# Patient Record
Sex: Female | Born: 1970 | Race: White | Hispanic: No | Smoking: Current every day smoker
Health system: Southern US, Community
[De-identification: ages and names within clinical notes are randomized; demographics above are authoritative.]

## PROBLEM LIST (undated history)

## (undated) DIAGNOSIS — F209 Schizophrenia, unspecified: Secondary | ICD-10-CM

## (undated) DIAGNOSIS — R0902 Hypoxemia: Secondary | ICD-10-CM

## (undated) DIAGNOSIS — I208 Other forms of angina pectoris: Secondary | ICD-10-CM

## (undated) DIAGNOSIS — E785 Hyperlipidemia, unspecified: Secondary | ICD-10-CM

## (undated) DIAGNOSIS — J449 Chronic obstructive pulmonary disease, unspecified: Secondary | ICD-10-CM

## (undated) DIAGNOSIS — Z720 Tobacco use: Secondary | ICD-10-CM

## (undated) DIAGNOSIS — F1010 Alcohol abuse, uncomplicated: Secondary | ICD-10-CM

## (undated) DIAGNOSIS — I2089 Other forms of angina pectoris: Secondary | ICD-10-CM

## (undated) HISTORY — DX: Hyperlipidemia, unspecified: E78.5

## (undated) HISTORY — DX: Schizophrenia, unspecified: F20.9

## (undated) HISTORY — DX: Alcohol abuse, uncomplicated: F10.10

## (undated) HISTORY — DX: Hypoxemia: R09.02

## (undated) HISTORY — DX: Other forms of angina pectoris: I20.8

## (undated) HISTORY — DX: Tobacco use: Z72.0

## (undated) HISTORY — DX: Chronic obstructive pulmonary disease, unspecified: J44.9

## (undated) HISTORY — DX: Other forms of angina pectoris: I20.89

---

## 2016-04-29 ENCOUNTER — Inpatient Hospital Stay (HOSPITAL_COMMUNITY)
Admission: AD | Admit: 2016-04-29 | Discharge: 2016-05-07 | DRG: 193 | Disposition: A | Discharge status: Home / Self Care | Payer: Medicaid Other | Source: Other Acute Inpatient Hospital | Attending: Internal Medicine | Admitting: Internal Medicine

## 2016-04-29 ENCOUNTER — Inpatient Hospital Stay (HOSPITAL_COMMUNITY): Payer: Medicaid Other

## 2016-04-29 DIAGNOSIS — F209 Schizophrenia, unspecified: Secondary | ICD-10-CM | POA: Diagnosis present

## 2016-04-29 DIAGNOSIS — F1010 Alcohol abuse, uncomplicated: Secondary | ICD-10-CM | POA: Diagnosis present

## 2016-04-29 DIAGNOSIS — J8489 Other specified interstitial pulmonary diseases: Secondary | ICD-10-CM | POA: Diagnosis present

## 2016-04-29 DIAGNOSIS — D696 Thrombocytopenia, unspecified: Secondary | ICD-10-CM | POA: Diagnosis not present

## 2016-04-29 DIAGNOSIS — F17210 Nicotine dependence, cigarettes, uncomplicated: Secondary | ICD-10-CM | POA: Diagnosis present

## 2016-04-29 DIAGNOSIS — R0902 Hypoxemia: Secondary | ICD-10-CM

## 2016-04-29 DIAGNOSIS — E785 Hyperlipidemia, unspecified: Secondary | ICD-10-CM | POA: Diagnosis present

## 2016-04-29 DIAGNOSIS — F329 Major depressive disorder, single episode, unspecified: Secondary | ICD-10-CM | POA: Diagnosis present

## 2016-04-29 DIAGNOSIS — J189 Pneumonia, unspecified organism: Secondary | ICD-10-CM | POA: Diagnosis present

## 2016-04-29 DIAGNOSIS — Z7952 Long term (current) use of systemic steroids: Secondary | ICD-10-CM

## 2016-04-29 DIAGNOSIS — J810 Acute pulmonary edema: Secondary | ICD-10-CM | POA: Diagnosis not present

## 2016-04-29 DIAGNOSIS — B349 Viral infection, unspecified: Secondary | ICD-10-CM | POA: Diagnosis present

## 2016-04-29 DIAGNOSIS — R0603 Acute respiratory distress: Secondary | ICD-10-CM

## 2016-04-29 DIAGNOSIS — R918 Other nonspecific abnormal finding of lung field: Secondary | ICD-10-CM | POA: Diagnosis not present

## 2016-04-29 DIAGNOSIS — R509 Fever, unspecified: Secondary | ICD-10-CM

## 2016-04-29 DIAGNOSIS — J440 Chronic obstructive pulmonary disease with acute lower respiratory infection: Secondary | ICD-10-CM | POA: Diagnosis present

## 2016-04-29 DIAGNOSIS — R7982 Elevated C-reactive protein (CRP): Secondary | ICD-10-CM | POA: Diagnosis present

## 2016-04-29 DIAGNOSIS — I959 Hypotension, unspecified: Secondary | ICD-10-CM | POA: Diagnosis present

## 2016-04-29 DIAGNOSIS — J9600 Acute respiratory failure, unspecified whether with hypoxia or hypercapnia: Secondary | ICD-10-CM

## 2016-04-29 DIAGNOSIS — E8770 Fluid overload, unspecified: Secondary | ICD-10-CM | POA: Diagnosis present

## 2016-04-29 DIAGNOSIS — D6959 Other secondary thrombocytopenia: Secondary | ICD-10-CM | POA: Diagnosis present

## 2016-04-29 DIAGNOSIS — Z720 Tobacco use: Secondary | ICD-10-CM | POA: Diagnosis not present

## 2016-04-29 DIAGNOSIS — J9601 Acute respiratory failure with hypoxia: Secondary | ICD-10-CM

## 2016-04-29 DIAGNOSIS — J441 Chronic obstructive pulmonary disease with (acute) exacerbation: Secondary | ICD-10-CM | POA: Diagnosis present

## 2016-04-29 DIAGNOSIS — J80 Acute respiratory distress syndrome: Secondary | ICD-10-CM | POA: Diagnosis present

## 2016-04-29 DIAGNOSIS — J159 Unspecified bacterial pneumonia: Secondary | ICD-10-CM | POA: Diagnosis present

## 2016-04-29 DIAGNOSIS — Z79899 Other long term (current) drug therapy: Secondary | ICD-10-CM

## 2016-04-29 DIAGNOSIS — F419 Anxiety disorder, unspecified: Secondary | ICD-10-CM | POA: Diagnosis present

## 2016-04-29 LAB — RESPIRATORY PANEL BY PCR

## 2016-04-29 LAB — COMPREHENSIVE METABOLIC PANEL
ALT: 136 U/L — ABNORMAL HIGH (ref 14–54)
AST: 83 U/L — ABNORMAL HIGH (ref 15–41)
Albumin: 2.6 g/dL — ABNORMAL LOW (ref 3.5–5.0)
Alkaline Phosphatase: 99 U/L (ref 38–126)
Anion gap: 12 (ref 5–15)
BUN: 15 mg/dL (ref 6–20)
CO2: 21 mmol/L — ABNORMAL LOW (ref 22–32)
Calcium: 8.3 mg/dL — ABNORMAL LOW (ref 8.9–10.3)
Chloride: 110 mmol/L (ref 101–111)
Creatinine, Ser: 0.73 mg/dL (ref 0.44–1.00)
GFR calc Af Amer: >60 mL/min (ref >60)
GFR calc non Af Amer: >60 mL/min (ref >60)
Glucose, Bld: 98 mg/dL (ref 65–99)
Potassium: 3.6 mmol/L (ref 3.5–5.1)
Sodium: 143 mmol/L (ref 135–145)
Total Bilirubin: 0.7 mg/dL (ref 0.3–1.2)
Total Protein: 6 g/dL — ABNORMAL LOW (ref 6.5–8.1)

## 2016-04-29 LAB — TROPONIN I
Troponin I: <0.03 ng/mL (ref <0.03)
Troponin I: <0.03 ng/mL (ref <0.03)
Troponin I: <0.03 ng/mL (ref <0.03)

## 2016-04-29 LAB — MAGNESIUM: MAGNESIUM: 1.7 mg/dL (ref 1.7–2.4)

## 2016-04-29 LAB — RAPID URINE DRUG SCREEN, HOSP PERFORMED
AMPHETAMINES: NOT DETECTED
Barbiturates: NOT DETECTED
Benzodiazepines: NOT DETECTED
Cocaine: NOT DETECTED
Opiates: NOT DETECTED
Tetrahydrocannabinol: NOT DETECTED

## 2016-04-29 LAB — GLUCOSE, CAPILLARY
Glucose-Capillary: 111 mg/dL — ABNORMAL HIGH (ref 65–99)
Glucose-Capillary: 116 mg/dL — ABNORMAL HIGH (ref 65–99)
Glucose-Capillary: 119 mg/dL — ABNORMAL HIGH (ref 65–99)
Glucose-Capillary: 142 mg/dL — ABNORMAL HIGH (ref 65–99)

## 2016-04-29 LAB — LACTIC ACID, PLASMA
LACTIC ACID, VENOUS: 1.3 mmol/L (ref 0.5–1.9)
Lactic Acid, Venous: 1 mmol/L (ref 0.5–1.9)

## 2016-04-29 LAB — CBC
HEMATOCRIT: 38.7 % (ref 36.0–46.0)
Hemoglobin: 13 g/dL (ref 12.0–15.0)
MCH: 32.6 pg (ref 26.0–34.0)
MCHC: 33.6 g/dL (ref 30.0–36.0)
MCV: 97 fL (ref 78.0–100.0)
Platelets: 95 10*3/uL — ABNORMAL LOW (ref 150–400)
RBC: 3.99 MIL/uL (ref 3.87–5.11)
RDW: 18.2 % — AB (ref 11.5–15.5)
WBC: 10.2 10*3/uL (ref 4.0–10.5)

## 2016-04-29 LAB — MRSA PCR SCREENING: MRSA BY PCR: NEGATIVE

## 2016-04-29 LAB — PHOSPHORUS: PHOSPHORUS: 3.3 mg/dL (ref 2.5–4.6)

## 2016-04-29 LAB — PROCALCITONIN: Procalcitonin: 4.27 ng/mL

## 2016-04-29 MED ORDER — SODIUM CHLORIDE 0.9 % IV SOLN: INTRAVENOUS | Status: DC

## 2016-04-29 MED ORDER — HEPARIN SODIUM (PORCINE) 5000 UNIT/ML IJ SOLN
5000.0000 [IU] | Freq: Three times a day (TID) | INTRAMUSCULAR | Status: DC
  Administered 2016-04-29 – 2016-05-07 (×24): 5000 [IU] via SUBCUTANEOUS
  Filled 2016-04-29 (×26): qty 1

## 2016-04-29 MED ORDER — DEXTROSE 5 % IV SOLN
500.0000 mg | INTRAVENOUS | Status: DC
  Administered 2016-04-29 – 2016-05-03 (×5): 500 mg via INTRAVENOUS
  Filled 2016-04-29 (×5): qty 500

## 2016-04-29 MED ORDER — SODIUM CHLORIDE 0.9 % IV SOLN
250.0000 mL | INTRAVENOUS | Status: DC | PRN
  Administered 2016-05-04: 10 mL/h via INTRAVENOUS

## 2016-04-29 MED ORDER — IBUPROFEN 400 MG PO TABS
400.0000 mg | ORAL_TABLET | Freq: Once | ORAL | Status: AC
  Administered 2016-04-29: 400 mg via ORAL
  Filled 2016-04-29: qty 1

## 2016-04-29 MED ORDER — PNEUMOCOCCAL VAC POLYVALENT 25 MCG/0.5ML IJ INJ
0.5000 mL | INJECTION | INTRAMUSCULAR | Status: AC
  Administered 2016-04-30: 0.5 mL via INTRAMUSCULAR
  Filled 2016-04-29: qty 0.5

## 2016-04-29 MED ORDER — HEPARIN SODIUM (PORCINE) 5000 UNIT/ML IJ SOLN: 5000.0000 [IU] | Freq: Three times a day (TID) | INTRAMUSCULAR | Status: DC

## 2016-04-29 MED ORDER — THIAMINE HCL 100 MG/ML IJ SOLN
100.0000 mg | Freq: Every day | INTRAMUSCULAR | Status: DC
  Administered 2016-04-29 – 2016-05-03 (×5): 100 mg via INTRAVENOUS
  Filled 2016-04-29: qty 1
  Filled 2016-04-29: qty 2
  Filled 2016-04-29 (×4): qty 1

## 2016-04-29 MED ORDER — ALBUTEROL SULFATE (2.5 MG/3ML) 0.083% IN NEBU: 2.5000 mg | INHALATION_SOLUTION | RESPIRATORY_TRACT | Status: DC | PRN

## 2016-04-29 MED ORDER — DEXTROSE 5 % IV SOLN
1.0000 g | INTRAVENOUS | Status: AC
  Administered 2016-04-29 – 2016-05-05 (×7): 1 g via INTRAVENOUS
  Filled 2016-04-29 (×7): qty 10

## 2016-04-29 MED ORDER — METHYLPREDNISOLONE SODIUM SUCC 40 MG IJ SOLR
40.0000 mg | Freq: Two times a day (BID) | INTRAMUSCULAR | Status: DC
  Administered 2016-04-29 (×2): 40 mg via INTRAVENOUS
  Filled 2016-04-29 (×3): qty 1

## 2016-04-29 MED ORDER — IPRATROPIUM-ALBUTEROL 0.5-2.5 (3) MG/3ML IN SOLN
3.0000 mL | Freq: Four times a day (QID) | RESPIRATORY_TRACT | Status: DC
  Administered 2016-04-29 – 2016-05-06 (×28): 3 mL via RESPIRATORY_TRACT
  Filled 2016-04-29 (×27): qty 3

## 2016-04-29 MED ORDER — FOLIC ACID 5 MG/ML IJ SOLN
1.0000 mg | Freq: Every day | INTRAMUSCULAR | Status: DC
  Administered 2016-04-29 – 2016-05-03 (×5): 1 mg via INTRAVENOUS
  Filled 2016-04-29 (×6): qty 0.2

## 2016-04-29 NOTE — Progress Notes (Signed)
Pharmacy Antibiotic Note  Katherine LusterDeborah A [name] is a 46 y.o. female admitted on 04/29/2016 with pneumonia.  Pharmacy has been consulted for ceftriaxone (also on azithromycin) dosing for community-acquired pneumonia.   Plan: Ceftriaxone 1g IV every 24 hours.  No renal adjustment needed - Pharmacy will sign off consult.   Height: 5' 3.5" (161.3 cm) Weight: 163 lb 9.3 oz (74.2 kg) IBW/kg (Calculated) : 53.55  Temp (24hrs), Avg:98.7 F (37.1 C), Min:98.7 F (37.1 C), Max:98.7 F (37.1 C)  No results for input(s): WBC, CREATININE, LATICACIDVEN, VANCOTROUGH, VANCOPEAK, VANCORANDOM, GENTTROUGH, GENTPEAK, GENTRANDOM, TOBRATROUGH, TOBRAPEAK, TOBRARND, AMIKACINPEAK, AMIKACINTROU, AMIKACIN in the last 168 hours.  CrCl cannot be calculated (No order found.).    Allergies  Allergen Reactions  . Doxycycline Swelling    Antimicrobials this admission: Ceftriaxone 04/29/16 >> Azithromycin 04/29/16 >>  Dose adjustments this admission: n/a  Microbiology results: 1/25 BCx:  1/25 Respiratory Panel PCR:  1/25 Sputum:  1/25 MRSA PCR:  Thank you for allowing pharmacy to be a part of this patient's care.  Link SnufferJessica Leontae Bostock, PharmD, BCPS Clinical Pharmacist Clinical Phone 04/29/2016 until 3:30 PM - [phone] After hours, please call #[phone] 04/29/2016 8:56 AM

## 2016-04-29 NOTE — H&P (Signed)
PULMONARY / CRITICAL CARE MEDICINE   Name: Katherine [name] MRN: [id number] DOB: 01-23-1971    ADMISSION DATE:  04/29/2016 CONSULTATION DATE:  04/29/2016  REFERRING MD:  Dr. Carver FilaFrisco, [facility]   CHIEF COMPLAINT:  Dyspnea   HISTORY OF PRESENT ILLNESS:   46 year old female with PMH of ETOH, tobacco use, hyperlipidemia and schizophrenia, [facility] with history of 4 days of fever, chills, cough, and body aches, on arrival to ED required NRB, CXR revealed diffuse bilateral interstitial opacities, ultimately [facility] for [facility]M.   PAST MEDICAL HISTORY :  She  has no past medical history on file.  PAST SURGICAL HISTORY: She  has no past surgical history on file.  Allergies  Allergen Reactions  . Doxycycline Swelling    No current facility-administered medications on file prior to encounter.    No current outpatient prescriptions on file prior to encounter.    FAMILY HISTORY:  Her has no family status information on file.    SOCIAL HISTORY: She    REVIEW OF SYSTEMS:   All negative; except for those that are bolded, which indicate positives.  Constitutional: weight loss, weight gain, night sweats, fevers, chills, fatigue, weakness.  HEENT: headaches, sore throat, sneezing, nasal congestion, post nasal drip, difficulty swallowing, tooth/dental problems, visual complaints, visual changes, ear aches. Neuro: difficulty with speech, weakness, numbness, ataxia. CV:  chest pain, orthopnea, PND, swelling in lower extremities, dizziness, palpitations, syncope.  Resp: cough, hemoptysis, dyspnea, wheezing. GI: heartburn, indigestion, abdominal pain, nausea, vomiting, diarrhea, constipation, change in bowel habits, loss of appetite, hematemesis, melena, hematochezia.  GU: dysuria, change in color of urine, urgency or frequency, flank pain, hematuria. MSK: joint pain or swelling, decreased range of motion. Psych:  change in mood or affect, depression, anxiety, suicidal ideations, homicidal ideations. Skin: rash, itching, bruising.  SUBJECTIVE:  Remains on NRB, tachypneic RR 30-40   VITAL SIGNS: BP 102/66   Pulse 84   Resp (!) 38   SpO2 93%   HEMODYNAMICS:    VENTILATOR SETTINGS:    INTAKE / OUTPUT: I/O last 3 completed shifts: In: 10 [I.V.:10] Out: -   PHYSICAL EXAMINATION: General: Adult female, no distress, lying in bed  Neuro: alert, oriented, follows commands, moves all extremities   HEENT: normocephalic  Cardiovascular: No MRG, Tachy, regular rhythm, NI S1/S2 Lungs: rhonchi noted throughout, diminished at bases, unlabored Abdomen: non-tender, non-distend, active bowel sounds  Musculoskeletal: no deformities  Skin: warm, dry, intact    LABS:  BMET No results for input(s): NA, K, CL, CO2, BUN, CREATININE, GLUCOSE in the last 168 hours.  Electrolytes No results for input(s): CALCIUM, MG, PHOS in the last 168 hours.  CBC No results for input(s): WBC, HGB, HCT, PLT in the last 168 hours.  Coag's No results for input(s): APTT, INR in the last 168 hours.  Sepsis Markers No results for input(s): LATICACIDVEN, PROCALCITON, O2SATVEN in the last 168 hours.  ABG No results for input(s): PHART, PCO2ART, PO2ART in the last 168 hours.  Liver Enzymes No results for input(s): AST, ALT, ALKPHOS, BILITOT, ALBUMIN in the last 168 hours.  Cardiac Enzymes No results for input(s): TROPONINI, PROBNP in the last 168 hours.  Glucose No results for input(s): GLUCAP in the last 168 hours.  Imaging No results found.   STUDIES:  CXR 1/25 > diffuse airspace disease consistent with diffuse PNR vs ARDS   CULTURES: Blood 1/25 > Sputum 1/25 > Urine 1/25 >  ANTIBIOTICS: Azithromycin 1/25 > Rocephin 1/25 >   SIGNIFICANT EVENTS: 1/25 > Presents to OSH with severe respiratory distress   LINES/TUBES:   DISCUSSION: 46 year old presents to ED with 4 day history of fever,  chills, and cough. CXR reveals bilateral diffuse interstitial opacities.   ASSESSMENT / PLAN:  PULMONARY A: Acute Hypoxic Respiratory Failure secondary to bacterial PNA vs viral source  P:   Maintain Saturation >92 Duoneb q6h Albuterol PRN Solu-medrol 50 mg BID  CXR now, and in AM ABG, and in AM  May need to be intubated   CARDIOVASCULAR A:  H/O HLD  P:  Cardiac Monitoring  Trend Trop Maintain MAP >65   RENAL A:   No issues  P:   Trend BMP Replace Electrolytes as needed   GASTROINTESTINAL A:   No issues  P:   NPO   HEMATOLOGIC A:   Thrombocytopenia  P:  Trend LFT  HIV panel  Trend CBC   INFECTIOUS A:   Suspected Bacterial PNA, possible underlying viral infection P:   Send RVP PAN culture  Trend lactic Acid and Procal  Rocephin and Azithromycin for CAP coverage   ENDOCRINE A:   No issues  P:   Q4H glucose checks   NEUROLOGIC A:   H/O Schizophrenia, ETOH  P:   Thiamine and Folic acid daily  RASS goal: 0  FAMILY  - Updates: no family at bedside 1/25.   - Inter-disciplinary family meet or Palliative Care meeting due by:  05/06/2016    Jovita Kussmaul, AG-ACNP [facility]  Pgr: [phone]  PCCM Pgr: [phone]

## 2016-04-30 ENCOUNTER — Ambulatory Visit (HOSPITAL_COMMUNITY): Payer: Medicaid Other

## 2016-04-30 ENCOUNTER — Inpatient Hospital Stay (HOSPITAL_COMMUNITY): Payer: Medicaid Other

## 2016-04-30 DIAGNOSIS — J80 Acute respiratory distress syndrome: Secondary | ICD-10-CM

## 2016-04-30 LAB — BASIC METABOLIC PANEL
Anion gap: 11 (ref 5–15)
BUN: 21 mg/dL — ABNORMAL HIGH (ref 6–20)
CALCIUM: 8.2 mg/dL — AB (ref 8.9–10.3)
CHLORIDE: 106 mmol/L (ref 101–111)
CO2: 23 mmol/L (ref 22–32)
CREATININE: 0.87 mg/dL (ref 0.44–1.00)
Glucose, Bld: 136 mg/dL — ABNORMAL HIGH (ref 65–99)
Potassium: 3.1 mmol/L — ABNORMAL LOW (ref 3.5–5.1)
SODIUM: 140 mmol/L (ref 135–145)

## 2016-04-30 LAB — BLOOD GAS, ARTERIAL
Acid-Base Excess: 0.9 mmol/L (ref 0.0–2.0)
Acid-Base Excess: 2.6 mmol/L — ABNORMAL HIGH (ref 0.0–2.0)
BICARBONATE: 27.5 mmol/L (ref 20.0–28.0)
Bicarbonate: 25.7 mmol/L (ref 20.0–28.0)
DRAWN BY: 418751
Delivery systems: POSITIVE
Drawn by: 418751
Expiratory PAP: 6
FIO2: 100
FIO2: 60
INSPIRATORY PAP: 12
O2 SAT: 92.4 %
O2 Saturation: 94.4 %
PATIENT TEMPERATURE: 98.6
PATIENT TEMPERATURE: 98.6
PCO2 ART: 46.8 mmHg (ref 32.0–48.0)
PO2 ART: 68.4 mmHg — AB (ref 83.0–108.0)
PO2 ART: 77.3 mmHg — AB (ref 83.0–108.0)
RATE: 8 resp/min
pCO2 arterial: 49.2 mmHg — ABNORMAL HIGH (ref 32.0–48.0)
pH, Arterial: 7.359 (ref 7.350–7.450)
pH, Arterial: 7.365 (ref 7.350–7.450)

## 2016-04-30 LAB — ECHOCARDIOGRAM COMPLETE
CHL CUP MV DEC (S): 204
E decel time: 204 msec
FS: 38 % (ref 28–44)
Height: 63.5 in
IV/PV OW: 0.89
LA diam end sys: 31 mm
LA diam index: 1.73 cm/m2
LA vol A4C: 44.3 ml
LASIZE: 31 mm
LDCA: 2.54 cm2
LV TDI E'LATERAL: 11.5
LV TDI E'MEDIAL: 9.25
LV e' LATERAL: 11.5 cm/s
LVOT SV: 77 mL
LVOT VTI: 30.3 cm
LVOT peak grad rest: 10 mmHg
LVOTD: 18 mm
LVOTPV: 157 cm/s
Lateral S' vel: 14.5 cm/s
MVPKEVEL: 1.4 m/s
PW: 9 mm — AB (ref 0.6–1.1)
RV TAPSE: 33.2 mm
Weight: 2645.52 oz

## 2016-04-30 LAB — GLUCOSE, CAPILLARY
GLUCOSE-CAPILLARY: 124 mg/dL — AB (ref 65–99)
Glucose-Capillary: 107 mg/dL — ABNORMAL HIGH (ref 65–99)
Glucose-Capillary: 130 mg/dL — ABNORMAL HIGH (ref 65–99)
Glucose-Capillary: 126 mg/dL — ABNORMAL HIGH (ref 65–99)

## 2016-04-30 LAB — CBC
HCT: 38.8 % (ref 36.0–46.0)
Hemoglobin: 12.9 g/dL (ref 12.0–15.0)
MCH: 33.1 pg (ref 26.0–34.0)
MCHC: 33.2 g/dL (ref 30.0–36.0)
MCV: 99.5 fL (ref 78.0–100.0)
PLATELETS: 116 10*3/uL — AB (ref 150–400)
RBC: 3.9 MIL/uL (ref 3.87–5.11)
RDW: 19.1 % — AB (ref 11.5–15.5)
WBC: 9.4 10*3/uL (ref 4.0–10.5)

## 2016-04-30 LAB — HEPATIC FUNCTION PANEL
ALBUMIN: 2.7 g/dL — AB (ref 3.5–5.0)
ALT: 120 U/L — ABNORMAL HIGH (ref 14–54)
AST: 78 U/L — AB (ref 15–41)
Alkaline Phosphatase: 99 U/L (ref 38–126)
Bilirubin, Direct: 0.3 mg/dL (ref 0.1–0.5)
Indirect Bilirubin: 0.4 mg/dL (ref 0.3–0.9)
TOTAL PROTEIN: 6.9 g/dL (ref 6.5–8.1)
Total Bilirubin: 0.7 mg/dL (ref 0.3–1.2)

## 2016-04-30 LAB — URINE CULTURE: Culture: NO GROWTH

## 2016-04-30 LAB — PHOSPHORUS: PHOSPHORUS: 2.5 mg/dL (ref 2.5–4.6)

## 2016-04-30 LAB — MAGNESIUM: MAGNESIUM: 1.9 mg/dL (ref 1.7–2.4)

## 2016-04-30 LAB — HIV ANTIBODY (ROUTINE TESTING W REFLEX): HIV Screen 4th Generation wRfx: NONREACTIVE

## 2016-04-30 LAB — PROCALCITONIN: PROCALCITONIN: 4.67 ng/mL

## 2016-04-30 MED ORDER — FUROSEMIDE 10 MG/ML IJ SOLN
20.0000 mg | Freq: Once | INTRAMUSCULAR | Status: AC
  Administered 2016-04-30: 20 mg via INTRAVENOUS
  Filled 2016-04-30: qty 2

## 2016-04-30 MED ORDER — SODIUM CHLORIDE 0.9 % IV SOLN
30.0000 meq | Freq: Once | INTRAVENOUS | Status: AC
  Administered 2016-04-30: 30 meq via INTRAVENOUS
  Filled 2016-04-30: qty 15

## 2016-04-30 MED ORDER — METHYLPREDNISOLONE SODIUM SUCC 40 MG IJ SOLR
40.0000 mg | Freq: Every day | INTRAMUSCULAR | Status: DC
  Administered 2016-04-30 – 2016-05-02 (×3): 40 mg via INTRAVENOUS
  Filled 2016-04-30 (×3): qty 1

## 2016-04-30 MED ORDER — ARIPIPRAZOLE 5 MG PO TABS
5.0000 mg | ORAL_TABLET | Freq: Every day | ORAL | Status: DC
  Administered 2016-04-30 – 2016-05-07 (×8): 5 mg via ORAL
  Filled 2016-04-30 (×8): qty 1

## 2016-04-30 MED ORDER — POTASSIUM CHLORIDE 20 MEQ/15ML (10%) PO SOLN: 40.0000 meq | Freq: Two times a day (BID) | ORAL | Status: DC

## 2016-04-30 MED ORDER — TRAZODONE HCL 100 MG PO TABS
100.0000 mg | ORAL_TABLET | Freq: Every evening | ORAL | Status: DC | PRN
  Administered 2016-04-30: 100 mg via ORAL
  Filled 2016-04-30 (×2): qty 1

## 2016-04-30 MED ORDER — HYDROXYZINE HCL 25 MG PO TABS
50.0000 mg | ORAL_TABLET | Freq: Three times a day (TID) | ORAL | Status: DC | PRN
  Administered 2016-04-30: 50 mg via ORAL
  Filled 2016-04-30 (×2): qty 1

## 2016-04-30 MED ORDER — DEXTROMETHORPHAN POLISTIREX ER 30 MG/5ML PO SUER
30.0000 mg | Freq: Two times a day (BID) | ORAL | Status: DC | PRN
  Administered 2016-04-30 – 2016-05-03 (×4): 30 mg via ORAL
  Filled 2016-04-30 (×5): qty 5

## 2016-04-30 MED ORDER — SERTRALINE HCL 50 MG PO TABS
25.0000 mg | ORAL_TABLET | Freq: Every day | ORAL | Status: DC
  Administered 2016-04-30 – 2016-05-07 (×8): 25 mg via ORAL
  Filled 2016-04-30 (×9): qty 1

## 2016-04-30 MED ORDER — ACETAMINOPHEN 325 MG PO TABS
650.0000 mg | ORAL_TABLET | Freq: Four times a day (QID) | ORAL | Status: DC | PRN
  Administered 2016-04-30 – 2016-05-07 (×6): 650 mg via ORAL
  Filled 2016-04-30 (×6): qty 2

## 2016-04-30 MED ORDER — ORAL CARE MOUTH RINSE
15.0000 mL | Freq: Two times a day (BID) | OROMUCOSAL | Status: DC
  Administered 2016-04-30 – 2016-05-07 (×11): 15 mL via OROMUCOSAL

## 2016-04-30 MED ORDER — SODIUM CHLORIDE 0.9 % IV BOLUS (SEPSIS)
1000.0000 mL | Freq: Once | INTRAVENOUS | Status: AC
  Administered 2016-04-30: 1000 mL via INTRAVENOUS

## 2016-04-30 NOTE — Progress Notes (Addendum)
S: Paged by RN for worsening tachypnea while sleeping. She was given 1L of fluid approximately 2 hours ago for soft BP.   O: On evaluation patient is tachypneic with increased work of breathing while sleeping. RR in the 40s but dropped to 12 after being woken up. Now  A&O x 3, breathing comfortable, and asymptomatic. She denies chest pain or worsening SOB. Desating to upper 80s on non re-breather.    Vitals:   04/30/16 0000 04/30/16 0030 04/30/16 0100 04/30/16 0130  BP: 92/65 (!) 88/45 (!) 95/47 (!) 96/53  Pulse: (!) 57 (!) 52 61 81  Resp: (!) 29 (!) 35 (!) 23 (!) 30  Temp:      TempSrc:      SpO2: 92% 92% 93% (!) 88%  Weight:      Height:       Physical Exam Constitutional: NAD, appears comfortable HEENT: on non re-breather  Cardiovascular: RRR, no murmurs, rubs, or gallops.  Pulmonary/Chest: Extensive bibasilar crackles that extend to the mid lungs bilaterally, sating 88% on non re breather, no wheezing, rales, or rhonchi   Neurological: A&Ox3, CN II - XII grossly intact.   A: 46 yo F with hx of alcohol use, HLD, and schizophrenia admitted with acute hypoxic respiratory failure secondary to CAP vs. viral infection; now with tachypnea after fluid resuscitation. CXR concerning for developing ARDS.   Plan: -- BiPAP trial  -- Will give 20 Lasix now  -- Follow BP -- Repeat CXR -- ABG  -- Continue to monitor   Reymundo Pollarolyn Ladana Chavero, M.D. Pager: [phone] 04/30/2016, 2:05 AM

## 2016-04-30 NOTE — Progress Notes (Signed)
Placed on BiPAP at this time.

## 2016-04-30 NOTE — Progress Notes (Signed)
  Echocardiogram 2D Echocardiogram has been performed.  Katherine [name] 04/30/2016, 2:38 PM

## 2016-04-30 NOTE — Progress Notes (Signed)
Repeat ABG obtained after 2 hours on BiPAP shows improvement of PaO2 at 77 on FiO2 of 60. P/F ratio improved from prior. CXR with some improvement of diffuse of airspace after breathing treatment, lasix 20 mg once and bipap. Patient is comfortable on bipap, sleeping. Discussed with attending, will continue current management and reassess in the morning. If persistently hypoxic in the morning or if patient acutely decompensates, would consider intubation.   Katherine LinemanAlexa Burns, DO PGY-3 Internal Medicine Resident Pager # [phone] 04/30/2016 4:27 AM

## 2016-04-30 NOTE — Progress Notes (Signed)
RN paged Dr.Guilloud regarding pt increased resp rate in the 40's. Pt sleeping; oxygen sat 93% on NRB.  MD aware. RN will follow orders and continue to monitor.  Roselie AwkwardMegan Dong Nimmons, RN

## 2016-04-30 NOTE — Progress Notes (Signed)
PULMONARY / CRITICAL CARE MEDICINE   Name: Katherine LusterDeborah A Pancoast MRN: [id number] DOB: 1971/01/07    ADMISSION DATE:  04/29/2016 CONSULTATION DATE:  04/29/2016  REFERRING MD:  Dr. Carver FilaFrisco, [facility]   CHIEF COMPLAINT:  Dyspnea   HISTORY OF PRESENT ILLNESS:   46 year old female with PMH of ETOH, tobacco use, hyperlipidemia and schizophrenia, presents [facility] with history of 4 days of fever, chills, cough, and body aches, on arrival to ED required NRB, CXR revealed diffuse bilateral interstitial opacities, ultimately [facility] for P[facility]M.   SUBJECTIVE:  She was given 1L IV fluids for mild hypotension overnight. Two hours later, she had increased tachypnea. She was given Lasix 20mg  IV and placed on BiPAP. She reports improvement in her breathing this morning. Denies chest pain. No other complaints.  VITAL SIGNS: BP 110/63 (BP Location: Right Arm)   Pulse 72   Temp 98.8 F (37.1 C) (Axillary)   Resp (!) 31   Ht 5' 3.5" (1.613 m)   Wt 165 lb 5.5 oz (75 kg)   SpO2 95%   BMI 28.83 kg/m   HEMODYNAMICS:    VENTILATOR SETTINGS: FiO2 (%):  [55 %-100 %] 60 %  INTAKE / OUTPUT: I/O last 3 completed shifts: In: 715 [I.V.:150; IV Piggyback:565] Out: 1160 [Urine:1160]  PHYSICAL EXAMINATION: General: Adult female, no distress, lying in bed  Neuro: alert, oriented, follows commands, moves all extremities   HEENT: normocephalic  Cardiovascular: RRR Lungs: Diminished at bases, unlabored Abdomen: non-tender, non-distend, active bowel sounds  Musculoskeletal: no deformities  Skin: warm, dry, intact    LABS:  BMET  Recent Labs Lab 04/29/16 0809 04/30/16 0221  NA 143 140  K 3.6 3.1*  CL 110 106  CO2 21* 23  BUN 15 21*  CREATININE 0.73 0.87  GLUCOSE 98 136*    Electrolytes  Recent Labs Lab 04/29/16 0809 04/29/16 1054 04/30/16 0221  CALCIUM 8.3*  --  8.2*  MG  --  1.7 1.9  PHOS  --  3.3 2.5    CBC  Recent  Labs Lab 04/29/16 0809 04/30/16 0221  WBC 10.2 9.4  HGB 13.0 12.9  HCT 38.7 38.8  PLT 95* 116*    Coag's No results for input(s): APTT, INR in the last 168 hours.  Sepsis Markers  Recent Labs Lab 04/29/16 0835 04/29/16 1054 04/30/16 0221  LATICACIDVEN 1.0 1.3  --   PROCALCITON 4.27  --  4.67    ABG  Recent Labs Lab 04/30/16 0231 04/30/16 0400  PHART 7.359 7.365  PCO2ART 46.8 49.2*  PO2ART 68.4* 77.3*    Liver Enzymes  Recent Labs Lab 04/29/16 0809  AST 83*  ALT 136*  ALKPHOS 99  BILITOT 0.7  ALBUMIN 2.6*    Cardiac Enzymes  Recent Labs Lab 04/29/16 1054 04/29/16 1406 04/29/16 2025  TROPONINI <0.03 <0.03 <0.03    Glucose  Recent Labs Lab 04/29/16 1207 04/29/16 1520 04/29/16 1932 04/29/16 2343  GLUCAP 142* 119* 116* 111*    Imaging Dg Chest Port 1 View  Result Date: 04/30/2016 CLINICAL DATA:  Initial evaluation for increase respiratory distress. EXAM: PORTABLE CHEST 1 VIEW COMPARISON:  Prior radiograph from 04/29/2016. FINDINGS: Mild cardiomegaly, stable. Mediastinal silhouette within normal limits. Lungs mildly hypoinflated. Diffuse airspace disease involving the bilateral lungs again seen, overall slightly improved. This may reflect improved edema or infection. No pleural effusion. No consolidative airspace disease. No pneumothorax. Osseous structures unchanged. IMPRESSION: 1. Slight interval improvement in  diffuse airspace and disease, which may reflect improved infection and/ or edema. 2. No other new acute cardiopulmonary abnormality identified. Electronically Signed   By: Rise Mu M.D.   On: 04/30/2016 03:52   Dg Chest Port 1 View  Result Date: 04/29/2016 CLINICAL DATA:  Acute respiratory failure EXAM: PORTABLE CHEST 1 VIEW COMPARISON:  None. FINDINGS: There is diffuse airspace disease throughout the lungs. These findings are nonspecific but could indicate diffuse pneumonia, acute respiratory distress syndrome, or less likely  edema. No effusion is seen. The heart is borderline enlarged. IMPRESSION: Diffuse airspace disease consistent with diffuse pneumonia or ARDS. Electronically Signed   By: Dwyane Dee M.D.   On: 04/29/2016 08:39     STUDIES:  CXR 1/25 > diffuse airspace disease consistent with diffuse PNA vs ARDS  CXR 1/26 > slight interval improvement of diffuse airspace disease Echo 1/26 >   CULTURES: Blood 1/25 > Sputum 1/25 > Urine 1/25 >   ANTIBIOTICS: Azithromycin 1/25 > Rocephin 1/25 >   SIGNIFICANT EVENTS: 1/25 > Presents to OSH with severe respiratory distress   LINES/TUBES: PIV  DISCUSSION: 46 year old woman presents to ED with 4 day history of fever, chills, and cough. CXR reveals bilateral diffuse interstitial opacities.   ASSESSMENT / PLAN:  PULMONARY A: Acute Hypoxic Respiratory Failure P:   Maintain Saturation >92 Duoneb scheduled Albuterol PRN Taper Solu-medrol 40 mg to daily Cont BiPAP  CARDIOVASCULAR A:  H/O HLD, Trops neg x 3 P:  Cardiac Monitoring  Maintain MAP >65   RENAL A:   No issues  P:   Trend BMP Replace Electrolytes as needed  Keep I/O even/negative  GASTROINTESTINAL A:   No issues  P:   NPO   HEMATOLOGIC A:   Thrombocytopenia, improving > HIV NR P:  Trend LFT  Trend CBC   INFECTIOUS A:   Suspected Bacterial PNA RVP neg P:    Trend Procal  Rocephin and Azithromycin for CAP coverage   ENDOCRINE A:   No issues  P:   Q4H glucose checks   NEUROLOGIC A:   H/O Schizophrenia, ETOH  P:   Thiamine and Folic acid daily  RASS goal: 0  FAMILY  - Updates: no family at bedside 1/25.   - Inter-disciplinary family meet or Palliative Care meeting due by:  05/06/2016    Griffin Basil, MD  Internal Medicine PGY-3 Pager: [phone]  PCCM Pgr: [phone] 04/30/16 8:27 AM

## 2016-04-30 NOTE — Care Management Note (Signed)
Case Management Note  Patient Details  Name: Odis LusterDeborah A Matney MRN: [id number] Date of Birth: [birth year]  Subjective/Objective:    Pt admitted with acute resp failure                Action/Plan:   PTA independent from home with friend.  CM will continue to follow for discharge needs   Expected Discharge Date:                  Expected Discharge Plan:  Home/Self Care  In-House Referral:     Discharge planning Services  CM Consult  Post Acute Care Choice:    Choice offered to:     DME Arranged:    DME Agency:     HH Arranged:    HH Agency:     Status of Service:  In process, will continue to follow  If discussed at Long Length of Stay Meetings, dates discussed:    Additional Comments:  Cherylann ParrClaxton, Dayon Witt S, RN 04/30/2016, 10:54 AM

## 2016-05-01 DIAGNOSIS — J9601 Acute respiratory failure with hypoxia: Secondary | ICD-10-CM

## 2016-05-01 DIAGNOSIS — F17210 Nicotine dependence, cigarettes, uncomplicated: Secondary | ICD-10-CM | POA: Diagnosis present

## 2016-05-01 DIAGNOSIS — D696 Thrombocytopenia, unspecified: Secondary | ICD-10-CM | POA: Diagnosis present

## 2016-05-01 LAB — COMPREHENSIVE METABOLIC PANEL
ALBUMIN: 2.3 g/dL — AB (ref 3.5–5.0)
ALT: 77 U/L — AB (ref 14–54)
AST: 48 U/L — ABNORMAL HIGH (ref 15–41)
Alkaline Phosphatase: 82 U/L (ref 38–126)
Anion gap: 6 (ref 5–15)
BUN: 22 mg/dL — AB (ref 6–20)
CHLORIDE: 108 mmol/L (ref 101–111)
CO2: 30 mmol/L (ref 22–32)
CREATININE: 0.66 mg/dL (ref 0.44–1.00)
Calcium: 8.1 mg/dL — ABNORMAL LOW (ref 8.9–10.3)
GFR calc Af Amer: >60 mL/min (ref >60)
GLUCOSE: 109 mg/dL — AB (ref 65–99)
Potassium: 3.6 mmol/L (ref 3.5–5.1)
Sodium: 144 mmol/L (ref 135–145)
Total Bilirubin: 1.5 mg/dL — ABNORMAL HIGH (ref 0.3–1.2)
Total Protein: 5.8 g/dL — ABNORMAL LOW (ref 6.5–8.1)

## 2016-05-01 LAB — CBC
HEMATOCRIT: 32.9 % — AB (ref 36.0–46.0)
HEMOGLOBIN: 11.6 g/dL — AB (ref 12.0–15.0)
MCH: 37.5 pg — AB (ref 26.0–34.0)
MCHC: 35.3 g/dL (ref 30.0–36.0)
MCV: 106.5 fL — AB (ref 78.0–100.0)
Platelets: 117 10*3/uL — ABNORMAL LOW (ref 150–400)
RBC: 3.09 MIL/uL — AB (ref 3.87–5.11)
RDW: 20.7 % — ABNORMAL HIGH (ref 11.5–15.5)
WBC: 6 10*3/uL (ref 4.0–10.5)

## 2016-05-01 LAB — PROCALCITONIN: PROCALCITONIN: 2.34 ng/mL

## 2016-05-01 LAB — GLUCOSE, CAPILLARY
GLUCOSE-CAPILLARY: 143 mg/dL — AB (ref 65–99)
GLUCOSE-CAPILLARY: 131 mg/dL — AB (ref 65–99)
Glucose-Capillary: 107 mg/dL — ABNORMAL HIGH (ref 65–99)
Glucose-Capillary: 120 mg/dL — ABNORMAL HIGH (ref 65–99)
Glucose-Capillary: 128 mg/dL — ABNORMAL HIGH (ref 65–99)
Glucose-Capillary: 81 mg/dL (ref 65–99)

## 2016-05-01 LAB — MAGNESIUM: Magnesium: 2.2 mg/dL (ref 1.7–2.4)

## 2016-05-01 LAB — PHOSPHORUS: Phosphorus: 2.2 mg/dL — ABNORMAL LOW (ref 2.5–4.6)

## 2016-05-01 LAB — HEPATITIS C ANTIBODY (REFLEX): HCV AB: 0.1 {s_co_ratio} (ref 0.0–0.9)

## 2016-05-01 LAB — HCV COMMENT:

## 2016-05-01 NOTE — Progress Notes (Signed)
PULMONARY / CRITICAL CARE MEDICINE   Name: Katherine LusterDeborah A Gramling MRN: [id number] DOB: 1970-12-19    ADMISSION DATE:  04/29/2016 CONSULTATION DATE:  04/29/2016  REFERRING MD:  Dr. Carver FilaFrisco, [facility]   CHIEF COMPLAINT:  Dyspnea   BRIEF SUMMARY:  46 year old female with PMH of ETOH, tobacco use, hyperlipidemia and schizophrenia, [facility] with history of 4 days of fever, chills, cough, and body aches, on arrival to ED required NRB, CXR revealed diffuse bilateral interstitial opacities, [facility] for [facility].   SUBJECTIVE: Tmax 99.5, remains on 15L HFNC > sats low 90's.  Pt reports feeling better overall.     VITAL SIGNS: BP 113/73   Pulse 82   Temp 99.5 F (37.5 C) (Oral)   Resp (!) 25   Ht 5' 3.5" (1.613 m)   Wt 163 lb 5.8 oz (74.1 kg)   SpO2 91%   BMI 28.48 kg/m   HEMODYNAMICS:    VENTILATOR SETTINGS:    INTAKE / OUTPUT: I/O last 3 completed shifts: In: 405 [I.V.:50; Other:90; IV Piggyback:265] Out: 1325 [Urine:1325]  PHYSICAL EXAMINATION: General:  Chronically ill appearing female in NAD HEENT: MM pink/moist, no jvd Neuro: AAOx4, speech clear, MAE  CV: s1s2 rrr, no m/r/g PULM: non-labored, crackles on L, clear on R FA:OZHYGI:soft, non-tender, bsx4 active  Extremities: warm/dry, no edema  Skin: no rashes or lesions.  Multiple tattoos.    LABS:  BMET  Recent Labs Lab 04/29/16 0809 04/30/16 0221 05/01/16 0310  NA 143 140 144  K 3.6 3.1* 3.6  CL 110 106 108  CO2 21* 23 30  BUN 15 21* 22*  CREATININE 0.73 0.87 0.66  GLUCOSE 98 136* 109*    Electrolytes  Recent Labs Lab 04/29/16 0809 04/29/16 1054 04/30/16 0221 05/01/16 0310  CALCIUM 8.3*  --  8.2* 8.1*  MG  --  1.7 1.9 2.2  PHOS  --  3.3 2.5 2.2*    CBC  Recent Labs Lab 04/29/16 0809 04/30/16 0221 05/01/16 0310  WBC 10.2 9.4 6.0  HGB 13.0 12.9 11.6*  HCT 38.7 38.8 32.9*  PLT 95* 116* 117*    Coag's No results  for input(s): APTT, INR in the last 168 hours.  Sepsis Markers  Recent Labs Lab 04/29/16 0835 04/29/16 1054 04/30/16 0221 05/01/16 0310  LATICACIDVEN 1.0 1.3  --   --   PROCALCITON 4.27  --  4.67 2.34    ABG  Recent Labs Lab 04/30/16 0231 04/30/16 0400  PHART 7.359 7.365  PCO2ART 46.8 49.2*  PO2ART 68.4* 77.3*    Liver Enzymes  Recent Labs Lab 04/29/16 0809 04/30/16 0854 05/01/16 0310  AST 83* 78* 48*  ALT 136* 120* 77*  ALKPHOS 99 99 82  BILITOT 0.7 0.7 1.5*  ALBUMIN 2.6* 2.7* 2.3*    Cardiac Enzymes  Recent Labs Lab 04/29/16 1054 04/29/16 1406 04/29/16 2025  TROPONINI <0.03 <0.03 <0.03    Glucose  Recent Labs Lab 04/30/16 1535 04/30/16 2042 05/01/16 0017 05/01/16 0518 05/01/16 0851 05/01/16 1233  GLUCAP 130* 126* 120* 107* 81 128*    Imaging No results found.   STUDIES:  CXR 1/25 > diffuse airspace disease consistent with diffuse PNA vs ARDS  CXR 1/26 > slight interval improvement of diffuse airspace disease Echo 1/26 > LVEF 55%, normal wall motion  CULTURES: Blood 1/25 > Sputum 1/25 > RVP 1/25 > negative  Urine 1/25 > negative  ANTIBIOTICS: Azithromycin 1/25 > Rocephin  1/25 >   SIGNIFICANT EVENTS: 1/25  Presents to OSH with severe respiratory distress  1/26 1L IV fluids for mild hypotension overnight > tachypnea.  Then lasix + BiPAP with improvement.   LINES/TUBES: PIV  DISCUSSION: 46 year old woman presents to ED with 4 day history of fever, chills, and cough. Subjectively concerning for flu with pneumonitis.  CXR reveals bilateral diffuse interstitial opacities.   ASSESSMENT / PLAN:  PULMONARY A: Acute Hypoxic Respiratory Failure  Diffuse Bilateral Airspace Disease - no consolidative airspace disease, possible flu pneumonitis  Tobacco Abuse - began age 42, 1ppd P:   Wean O2 for sats >90% Pulmonary hygiene - IS, mobilize Duoneb Q6 + PRN albuterol  Assess CT chest to review parenchyma  Solumedrol 40 mg QD PRN  bipap support Delsym PRN Pt out of window for tamiflu > 2 wks of illness Monitor in ICU for now  CARDIOVASCULAR A:  H/O HLD R/O MI -  Trops neg x 3 P:  ICU monitoring   RENAL A:   No issues  P:   Trend BMP / UOP  Replace electrolytes as indicated   GASTROINTESTINAL A:   No acute issues - HCV negative on admit P:   Clear liquid diet   HEMATOLOGIC A:   Thrombocytopenia, improving > HIV NR Hx ETOH  P:  Trend CBC  Heparin for DVT prophylaxis   INFECTIOUS A:   Suspected Bacterial PNA Hx Flu-like Symptoms PTA - 2 wks illness RVP neg P:    Trend PCT  Monitor CXR HIV/Hep C negative on admit  ENDOCRINE A:   No issues  P:   Monitor glucose  NEUROLOGIC A:   H/O Schizophrenia, ETOH  P:   Thiamine, folate, MVI QD Continue abilify, zoloft PRN hydroxyzine for anxiety  FAMILY  - Updates:  Patient and family updated at bedside 1/27.    - Inter-disciplinary family meet or Palliative Care meeting due by:  05/06/2016   CC Time: 30 minutes  Canary Brim, NP-C  Pulmonary & Critical Care Pgr: [phone] or if no answer [phone] 05/01/2016, 12:48 PM

## 2016-05-02 ENCOUNTER — Inpatient Hospital Stay (HOSPITAL_COMMUNITY): Payer: Medicaid Other

## 2016-05-02 LAB — GLUCOSE, CAPILLARY
GLUCOSE-CAPILLARY: 101 mg/dL — AB (ref 65–99)
GLUCOSE-CAPILLARY: 111 mg/dL — AB (ref 65–99)
GLUCOSE-CAPILLARY: 223 mg/dL — AB (ref 65–99)
GLUCOSE-CAPILLARY: 89 mg/dL (ref 65–99)
Glucose-Capillary: 110 mg/dL — ABNORMAL HIGH (ref 65–99)
Glucose-Capillary: 146 mg/dL — ABNORMAL HIGH (ref 65–99)
Glucose-Capillary: 84 mg/dL (ref 65–99)

## 2016-05-02 LAB — BASIC METABOLIC PANEL
ANION GAP: 8 (ref 5–15)
BUN: 22 mg/dL — ABNORMAL HIGH (ref 6–20)
CALCIUM: 7.9 mg/dL — AB (ref 8.9–10.3)
CHLORIDE: 104 mmol/L (ref 101–111)
CO2: 29 mmol/L (ref 22–32)
CREATININE: 0.53 mg/dL (ref 0.44–1.00)
GFR calc non Af Amer: >60 mL/min (ref >60)
Glucose, Bld: 101 mg/dL — ABNORMAL HIGH (ref 65–99)
Potassium: 3.6 mmol/L (ref 3.5–5.1)
Sodium: 141 mmol/L (ref 135–145)

## 2016-05-02 LAB — URINALYSIS, ROUTINE W REFLEX MICROSCOPIC
Bacteria, UA: NONE SEEN
Glucose, UA: NEGATIVE mg/dL
Hgb urine dipstick: NEGATIVE
KETONES UR: NEGATIVE mg/dL
LEUKOCYTES UA: NEGATIVE
Nitrite: NEGATIVE
PROTEIN: 30 mg/dL — AB
Specific Gravity, Urine: 1.017 (ref 1.005–1.030)
WBC UA: NONE SEEN WBC/hpf (ref 0–5)
pH: 9 — ABNORMAL HIGH (ref 5.0–8.0)

## 2016-05-02 LAB — CBC
HCT: 33.9 % — ABNORMAL LOW (ref 36.0–46.0)
Hemoglobin: 10.8 g/dL — ABNORMAL LOW (ref 12.0–15.0)
MCH: 31.5 pg (ref 26.0–34.0)
MCHC: 31.9 g/dL (ref 30.0–36.0)
MCV: 98.8 fL (ref 78.0–100.0)
PLATELETS: 114 10*3/uL — AB (ref 150–400)
RBC: 3.43 MIL/uL — AB (ref 3.87–5.11)
RDW: 17.7 % — AB (ref 11.5–15.5)
WBC: 5.6 10*3/uL (ref 4.0–10.5)

## 2016-05-02 LAB — MAGNESIUM: MAGNESIUM: 2.2 mg/dL (ref 1.7–2.4)

## 2016-05-02 MED ORDER — INSULIN ASPART 100 UNIT/ML ~~LOC~~ SOLN
0.0000 [IU] | SUBCUTANEOUS | Status: DC
  Administered 2016-05-02 – 2016-05-03 (×2): 3 [IU] via SUBCUTANEOUS
  Administered 2016-05-03 (×2): 2 [IU] via SUBCUTANEOUS
  Administered 2016-05-04: 1 [IU] via SUBCUTANEOUS
  Administered 2016-05-04: 2 [IU] via SUBCUTANEOUS
  Administered 2016-05-04: 5 [IU] via SUBCUTANEOUS

## 2016-05-02 MED ORDER — FUROSEMIDE 10 MG/ML IJ SOLN
40.0000 mg | Freq: Once | INTRAMUSCULAR | Status: AC
  Administered 2016-05-02: 40 mg via INTRAVENOUS
  Filled 2016-05-02: qty 4

## 2016-05-02 MED ORDER — PREDNISONE 20 MG PO TABS
40.0000 mg | ORAL_TABLET | Freq: Every day | ORAL | Status: DC
  Administered 2016-05-03 – 2016-05-07 (×5): 40 mg via ORAL
  Filled 2016-05-02 (×5): qty 2

## 2016-05-02 NOTE — Progress Notes (Signed)
Pt transported to CT and back to 24M on 100% NRB with BIPAP for use while in CT. Pt tolerated well.

## 2016-05-02 NOTE — Progress Notes (Signed)
Patient voided tea- colored urine with no other signs of bleeding. Awaiting CBC results, will continue to monitor for bleeding. Suzy Bouchardhompson, Momen Ham E, [place] 05/02/2016 0330

## 2016-05-02 NOTE — Progress Notes (Signed)
PULMONARY / CRITICAL CARE MEDICINE   Name: Katherine LusterDeborah A Sudberry MRN: [id number] DOB: 1970-09-06    ADMISSION DATE:  04/29/2016 CONSULTATION DATE:  04/29/2016  REFERRING MD:  Dr. Carver FilaFrisco, [facility]   CHIEF COMPLAINT:  Dyspnea   BRIEF SUMMARY:  46 year old female with PMH of ETOH, tobacco use, hyperlipidemia and schizophrenia, [facility] with history of 4 days of fever, chills, cough, and body aches, on arrival to ED required NRB, CXR revealed diffuse bilateral interstitial opacities, ultimately [facility] for [facility]M.   SUBJECTIVE: Doing well this morning. Feels better in terms of breathing. Denies chest pain.   VITAL SIGNS: BP (!) 87/51   Pulse (!) 54   Temp 97.6 F (36.4 C) (Oral)   Resp (!) 26   Ht 5' 3.5" (1.613 m)   Wt 163 lb 5.8 oz (74.1 kg)   SpO2 97%   BMI 28.48 kg/m    VENTILATOR SETTINGS: FiO2 (%):  [90 %] 90 %  INTAKE / OUTPUT: I/O last 3 completed shifts: In: 580 [P.O.:120; I.V.:30; Other:130; IV Piggyback:300] Out: 1426 [Urine:1425; Stool:1]  PHYSICAL EXAMINATION: General:  Chronically ill appearing female in NAD HEENT: MM pink/moist, no jvd Neuro: AAOx4, speech clear, MAE  CV: s1s2 rrr, no m/r/g PULM: non-labored, crackles on L, clear on R NW:GNFAGI:soft, non-tender, bsx4 active  Extremities: warm/dry, no edema  Skin: no rashes or lesions.  Multiple tattoos.    LABS:  BMET  Recent Labs Lab 04/30/16 0221 05/01/16 0310 05/02/16 0223  NA 140 144 141  K 3.1* 3.6 3.6  CL 106 108 104  CO2 23 30 29   BUN 21* 22* 22*  CREATININE 0.87 0.66 0.53  GLUCOSE 136* 109* 101*    Electrolytes  Recent Labs Lab 04/29/16 1054 04/30/16 0221 05/01/16 0310 05/02/16 0223  CALCIUM  --  8.2* 8.1* 7.9*  MG 1.7 1.9 2.2 2.2  PHOS 3.3 2.5 2.2*  --     CBC  Recent Labs Lab 04/30/16 0221 05/01/16 0310 05/02/16 0223  WBC 9.4 6.0 5.6  HGB 12.9 11.6* 10.8*  HCT 38.8 32.9* 33.9*  PLT 116* 117*  114*    Coag's No results for input(s): APTT, INR in the last 168 hours.  Sepsis Markers  Recent Labs Lab 04/29/16 0835 04/29/16 1054 04/30/16 0221 05/01/16 0310  LATICACIDVEN 1.0 1.3  --   --   PROCALCITON 4.27  --  4.67 2.34    ABG  Recent Labs Lab 04/30/16 0231 04/30/16 0400  PHART 7.359 7.365  PCO2ART 46.8 49.2*  PO2ART 68.4* 77.3*    Liver Enzymes  Recent Labs Lab 04/29/16 0809 04/30/16 0854 05/01/16 0310  AST 83* 78* 48*  ALT 136* 120* 77*  ALKPHOS 99 99 82  BILITOT 0.7 0.7 1.5*  ALBUMIN 2.6* 2.7* 2.3*    Cardiac Enzymes  Recent Labs Lab 04/29/16 1054 04/29/16 1406 04/29/16 2025  TROPONINI <0.03 <0.03 <0.03    Glucose  Recent Labs Lab 05/01/16 0851 05/01/16 1233 05/01/16 1643 05/01/16 1930 05/02/16 0040 05/02/16 0345  GLUCAP 81 128* 143* 131* 110* 101*    Imaging No results found.   STUDIES:  CXR 1/25 > diffuse airspace disease consistent with diffuse PNA vs ARDS  CXR 1/26 > slight interval improvement of diffuse airspace disease Echo 1/26 > LVEF 55%, normal wall motion CT chest 1/28 > Diffuse bilateral interstitial opacity with geographic ground-glass opacities throughout the lungs favoring the lung bases, and some confluent dependent airspace  opacities in the lower lobes. Secondary pulmonary lobular thickening.   CULTURES: Blood 1/25 > NGTD Sputum 1/25 > none collected RVP 1/25 > negative  Urine 1/25 > negative  ANTIBIOTICS: Azithromycin 1/25 > Rocephin 1/25 >   SIGNIFICANT EVENTS: 1/25  Presents to OSH with severe respiratory distress  1/26 1L IV fluids for mild hypotension overnight > tachypnea.  Then lasix + BiPAP with improvement.   LINES/TUBES: PIV  DISCUSSION: 46 year old woman presents to ED with 4 day history of fever, chills, and cough. Subjectively concerning for flu with pneumonitis.  CXR reveals bilateral diffuse interstitial opacities.   ASSESSMENT / PLAN:  PULMONARY A: Acute Hypoxic Respiratory  Failure  Diffuse Bilateral Interstitial Airspace Disease Tobacco Abuse - began age 69, 1ppd P:   Wean O2 for sats >90% Pulmonary hygiene - IS, mobilize Duoneb Q6 + PRN albuterol  Solumedrol 40 mg QD PRN bipap support Delsym PRN   CARDIOVASCULAR A:  H/O HLD R/O MI -  Trops neg x 3 P:  Cardiac monitoring  RENAL A:   No issues  P:   Trend BMP / UOP  Replace electrolytes as indicated   GASTROINTESTINAL A:   No acute issues - HCV negative on admit P:   Clear liquid diet   HEMATOLOGIC A:   Thrombocytopenia, improving > HIV NR Hx ETOH  P:  Trend CBC  Heparin for DVT prophylaxis   INFECTIOUS A:   Suspected Bacterial PNA Hx Flu-like Symptoms PTA - 2 wks illness RVP neg P:    Trend PCT  Monitor CXR HIV/Hep C negative on admit Azithromycin 1/25 > Rocephin 1/25 >   ENDOCRINE A:   No issues  P:   Monitor glucose  NEUROLOGIC A:   H/O Schizophrenia, ETOH  P:   Thiamine, folate, MVI QD Continue abilify, zoloft PRN hydroxyzine for anxiety  FAMILY  - Updates:  Patient and family updated at bedside 1/27.    - Inter-disciplinary family meet or Palliative Care meeting due by:  05/06/2016   Griffin Basil, MD  Internal Medicine PGY-3 Pager: [phone] If no answer [phone] 05/02/2016, 7:28 AM

## 2016-05-03 ENCOUNTER — Encounter (HOSPITAL_COMMUNITY): Payer: Self-pay | Admitting: *Deleted

## 2016-05-03 DIAGNOSIS — J189 Pneumonia, unspecified organism: Secondary | ICD-10-CM | POA: Diagnosis present

## 2016-05-03 LAB — GLUCOSE, CAPILLARY
GLUCOSE-CAPILLARY: 80 mg/dL (ref 65–99)
GLUCOSE-CAPILLARY: 220 mg/dL — AB (ref 65–99)
Glucose-Capillary: 91 mg/dL (ref 65–99)
Glucose-Capillary: 191 mg/dL — ABNORMAL HIGH (ref 65–99)
Glucose-Capillary: 182 mg/dL — ABNORMAL HIGH (ref 65–99)

## 2016-05-03 LAB — BASIC METABOLIC PANEL
Anion gap: 9 (ref 5–15)
BUN: 21 mg/dL — ABNORMAL HIGH (ref 6–20)
CO2: 31 mmol/L (ref 22–32)
CREATININE: 0.61 mg/dL (ref 0.44–1.00)
Calcium: 8.1 mg/dL — ABNORMAL LOW (ref 8.9–10.3)
Chloride: 99 mmol/L — ABNORMAL LOW (ref 101–111)
GFR calc Af Amer: >60 mL/min (ref >60)
GFR calc non Af Amer: >60 mL/min (ref >60)
GLUCOSE: 88 mg/dL (ref 65–99)
POTASSIUM: 3.3 mmol/L — AB (ref 3.5–5.1)
Sodium: 139 mmol/L (ref 135–145)

## 2016-05-03 LAB — CBC
HCT: 34.7 % — ABNORMAL LOW (ref 36.0–46.0)
Hemoglobin: 11.1 g/dL — ABNORMAL LOW (ref 12.0–15.0)
MCH: 31.6 pg (ref 26.0–34.0)
MCHC: 32 g/dL (ref 30.0–36.0)
MCV: 98.9 fL (ref 78.0–100.0)
PLATELETS: 126 10*3/uL — AB (ref 150–400)
RBC: 3.51 MIL/uL — AB (ref 3.87–5.11)
RDW: 17.1 % — ABNORMAL HIGH (ref 11.5–15.5)
WBC: 7.5 10*3/uL (ref 4.0–10.5)

## 2016-05-03 LAB — PHOSPHORUS: PHOSPHORUS: 2 mg/dL — AB (ref 2.5–4.6)

## 2016-05-03 LAB — C-REACTIVE PROTEIN: CRP: 8.2 mg/dL — ABNORMAL HIGH (ref <1.0)

## 2016-05-03 LAB — MAGNESIUM: MAGNESIUM: 2.2 mg/dL (ref 1.7–2.4)

## 2016-05-03 LAB — SEDIMENTATION RATE: SED RATE: 80 mm/h — AB (ref 0–22)

## 2016-05-03 MED ORDER — VITAMIN B-1 100 MG PO TABS
100.0000 mg | ORAL_TABLET | Freq: Every day | ORAL | Status: DC
  Administered 2016-05-04 – 2016-05-07 (×4): 100 mg via ORAL
  Filled 2016-05-03 (×4): qty 1

## 2016-05-03 MED ORDER — ENSURE ENLIVE PO LIQD
237.0000 mL | Freq: Two times a day (BID) | ORAL | Status: DC
  Administered 2016-05-03 – 2016-05-04 (×3): 237 mL via ORAL

## 2016-05-03 MED ORDER — FOLIC ACID 1 MG PO TABS
1.0000 mg | ORAL_TABLET | Freq: Every day | ORAL | Status: DC
  Administered 2016-05-04 – 2016-05-07 (×4): 1 mg via ORAL
  Filled 2016-05-03 (×4): qty 1

## 2016-05-03 MED ORDER — POTASSIUM CHLORIDE CRYS ER 20 MEQ PO TBCR
40.0000 meq | EXTENDED_RELEASE_TABLET | Freq: Once | ORAL | Status: AC
  Administered 2016-05-03: 40 meq via ORAL
  Filled 2016-05-03: qty 2

## 2016-05-03 MED ORDER — POTASSIUM CHLORIDE CRYS ER 20 MEQ PO TBCR
40.0000 meq | EXTENDED_RELEASE_TABLET | Freq: Two times a day (BID) | ORAL | Status: AC
  Administered 2016-05-03: 40 meq via ORAL
  Filled 2016-05-03: qty 2

## 2016-05-03 MED ORDER — FUROSEMIDE 10 MG/ML IJ SOLN
40.0000 mg | Freq: Once | INTRAMUSCULAR | Status: AC
  Administered 2016-05-03: 40 mg via INTRAVENOUS
  Filled 2016-05-03: qty 4

## 2016-05-03 NOTE — Progress Notes (Addendum)
Initial Nutrition Assessment  DOCUMENTATION CODES:   Non-severe (moderate) malnutrition in context of acute illness/injury  INTERVENTION:    Ensure Enlive po BID to help prevent further depletion of muscle mass, each supplement provides 350 kcal and 20 grams of protein  NUTRITION DIAGNOSIS:   Malnutrition related to acute illness (flu-like illness) as evidenced by energy intake < 75% for > 7 days, mild depletion of muscle mass, percent weight loss (>/= 5% within 1 month).  GOAL:   Patient will meet greater than or equal to 90% of their needs  MONITOR:   PO intake, Supplement acceptance, Weight trends, Labs, Skin  REASON FOR ASSESSMENT:   Malnutrition Screening Tool    ASSESSMENT:   46 year old female with PMH of ETOH & tobacco use, hyperlipidemia, and schizophrenia, who presented to ED with 4 day history of fever, chills, and cough. Subjectively concerning for flu with pneumonitis. CXR reveals bilateral diffuse interstitial opacities.   Patient reports recent poor appetite and poor intake for the past 2 weeks due to current illness. She has lost ~9-12 lbs in the past few weeks. Appetite is starting to improve. She agreed to continue receiving Ensure supplements BID. Nutrition-Focused physical exam completed. Findings are no fat depletion, mild muscle depletion, and no edema.  She has lost >/= 5% of usual body weight within the past month. From discussion with patient about usual intake PTA, she was consuming < 75% of estimated energy requirement for > past 7 days. Patient with moderate PCM related to acute illness. Labs reviewed: low potassium 3.3, low phosphorus 2 Medications reviewed and include folic acid, KCl, prednisone, thiamine.   Diet Order:  Diet regular Room service appropriate? Yes; Fluid consistency: Thin  Skin:  Reviewed, no issues  Last BM:  1/29  Height:   Ht Readings from Last 1 Encounters:  04/29/16 5' 3.5" (1.613 m)    Weight:   Wt Readings  from Last 1 Encounters:  05/03/16 156 lb 15.5 oz (71.2 kg)    Ideal Body Weight:  53.4 kg  BMI:  Body mass index is 27.37 kg/m.  Estimated Nutritional Needs:   Kcal:  1700-1900  Protein:  85-95 gm  Fluid:  1.7-1.9 L  EDUCATION NEEDS:   No education needs identified at this time  Joaquin CourtsKimberly Signa Cheek, RD, LDN, CNSC Pager [phone] After Hours Pager [phone]

## 2016-05-03 NOTE — Progress Notes (Signed)
PULMONARY / CRITICAL CARE MEDICINE   Name: Katherine [name] MRN: [id number] DOB: [birth year]    ADMISSION DATE:  04/29/2016 CONSULTATION DATE:  04/29/2016  REFERRING MD:  Dr. Gypsy Balsam, Lynch:  Dyspnea   BRIEF SUMMARY:  46 year old female with PMH of ETOH, tobacco use, hyperlipidemia and schizophrenia, presents to [facility] on 1/25 with history of 4 days of fever, chills, cough, and body aches, on arrival to ED required NRB, CXR revealed diffuse bilateral interstitial opacities, ultimately transferred to [facility] for admit for PCCM.   SUBJECTIVE: Doing well this morning. Breathing is ok and stable from yesterday.   VITAL SIGNS: BP (!) 87/57   Pulse 66   Temp 98.9 F (37.2 C) (Oral)   Resp (!) 31   Ht 5' 3.5" (1.613 m)   Wt 156 lb 15.5 oz (71.2 kg)   SpO2 96%   BMI 27.37 kg/m    VENTILATOR SETTINGS: FiO2 (%):  [80 %-90 %] 80 %  INTAKE / OUTPUT: I/O last 3 completed shifts: In: 1330 [P.O.:900; I.V.:90; Other:40; IV Piggyback:300] Out: 2651 [Urine:2650; Stool:1]  PHYSICAL EXAMINATION: General:  Chronically ill appearing female in NAD HEENT: MM pink/moist, no jvd Neuro: AAOx4, speech clear, MAE  CV: s1s2 rrr, no m/r/g PULM: non-labored, crackles on L, clear on R PT:WSFK, non-tender, bsx4 active  Extremities: warm/dry, no edema  Skin: no rashes or lesions.  Multiple tattoos.    LABS:  BMET  Recent Labs Lab 05/01/16 0310 05/02/16 0223 05/03/16 0203  NA 144 141 139  K 3.6 3.6 3.3*  CL 108 104 99*  CO2 _0 BUN 22* 22* 21*  CREATININE 0.66 0.53 0.61  GLUCOSE 109* 101* 88    Electrolytes  Recent Labs Lab 04/30/16 0221 05/01/16 0310 05/02/16 0223 05/03/16 0203  CALCIUM 8.2* 8.1* 7.9* 8.1*  MG 1.9 2.2 2.2 2.2  PHOS 2.5 2.2*  --  2.0*    CBC  Recent Labs Lab 05/01/16 0310 05/02/16 0223 05/03/16 0337  WBC 6.0 5.6 7.5  HGB 11.6* 10.8* 11.1*  HCT 32.9* 33.9* 34.7*  PLT 117* 114* 126*     Coag's No results for input(s): APTT, INR in the last 168 hours.  Sepsis Markers  Recent Labs Lab 04/29/16 0835 04/29/16 1054 04/30/16 0221 05/01/16 0310  LATICACIDVEN 1.0 1.3  --   --   PROCALCITON 4.27  --  4.67 2.34    ABG  Recent Labs Lab 04/30/16 0231 04/30/16 0400  PHART 7.359 7.365  PCO2ART 46.8 49.2*  PO2ART 68.4* 77.3*    Liver Enzymes  Recent Labs Lab 04/29/16 0809 04/30/16 0854 05/01/16 0310  AST 83* 78* 48*  ALT 136* 120* 77*  ALKPHOS 99 99 82  BILITOT 0.7 0.7 1.5*  ALBUMIN 2.6* 2.7* 2.3*    Cardiac Enzymes  Recent Labs Lab 04/29/16 1054 04/29/16 1406 04/29/16 2025  TROPONINI <0.03 <0.03 <0.03    Glucose  Recent Labs Lab 05/02/16 0855 05/02/16 1207 05/02/16 1659 05/02/16 1939 05/02/16 2344 05/03/16 0334  GLUCAP 84 89 146* 223* 111* 91    Imaging No results found.   STUDIES:  CXR 1/25 > diffuse airspace disease consistent with diffuse PNA vs ARDS  CXR 1/26 > slight interval improvement of diffuse airspace disease Echo 1/26 > LVEF 55%, normal wall motion CT chest 1/28 > Diffuse bilateral interstitial opacity with geographic ground-glass opacities throughout the lungs favoring the lung bases, and some confluent dependent airspace opacities in the lower  lobes. Secondary pulmonary lobular thickening.   CULTURES: Blood 1/25 > NGTD Sputum 1/25 > none collected RVP 1/25 > negative  Urine 1/25 > negative  ANTIBIOTICS: Azithromycin 1/25 > Rocephin 1/25 >   SIGNIFICANT EVENTS: 1/25  Presents to OSH with severe respiratory distress  1/26 1L IV fluids for mild hypotension overnight > tachypnea.  Then lasix + BiPAP with improvement.   LINES/TUBES: PIV  DISCUSSION: 46 year old woman presents to ED with 4 day history of fever, chills, and cough. Subjectively concerning for flu with pneumonitis.  CXR reveals bilateral diffuse interstitial opacities.   ASSESSMENT / PLAN:  PULMONARY A: Acute Hypoxic Respiratory Failure   Diffuse Bilateral Interstitial Airspace Disease Tobacco Abuse - began age 56, 1ppd P:   Wean O2 for sats >90% Pulmonary hygiene - IS, mobilize Duoneb Q6 + PRN albuterol  Prednisone taper PRN bipap support Delsym PRN Serologies sent for autoimmune work up including ANA, ANCA, CCP, RF. ESR and CRP elevated. May consider further diuresis as BUN/Cr stable.  INFECTIOUS A:   Suspected Bacterial PNA Hx Flu-like Symptoms PTA - 2 wks illness RVP neg P:    HIV/Hep C negative on admit Azithromycin 1/25 > Rocephin 1/25 >   Jacques Earthly, MD  Internal Medicine PGY-3 Pager: [phone] If no answer [phone] 05/03/2016, 7:44 AM   ATTENDING NOTE / ATTESTATION NOTE :   I have discussed the case with the resident/APP Dr. Jacques Earthly.   I agree with the resident/APP's  history, physical examination, assessment, and plans.    I have edited the above note and modified it according to our agreed history, physical examination, assessment and plan.   46 year old female with PMH of ETOH, tobacco use, hyperlipidemia and schizophrenia, presents to [facility] on 1/25 with history of 4 days of fever, chills, cough, and body aches, on arrival to ED required NRB, CXR revealed diffuse bilateral interstitial opacities, ultimately transferred to [facility] for admit for PCCM. Pt has been weaned off from BipaP.  Currently on HFNC at 70% and 20LPM. Comfortable.   Vitals:  Vitals:   05/03/16 0600 05/03/16 0746 05/03/16 1100 05/03/16 1148  BP: (!) 87/57  100/61   Pulse: 66  70   Resp: (!) 31  (!) 22   Temp:    98.9 F (37.2 C)  TempSrc:    Oral  SpO2: 96% 95% 97%   Weight:      Height:        Constitutional/General:  Pleasant, well-nourished, well-developed, in mild distress,  Comfortably seating.  Well kempt  Body mass index is 27.37 kg/m. Wt Readings from Last 3 Encounters:  05/03/16 71.2 kg (156 lb 15.5 oz)    HEENT: Pupils equal and reactive to light and accommodation. Anicteric  sclerae. Normal nasal mucosa.   No oral  lesions,  mouth clear,  oropharynx clear, no postnasal drip. (-) Oral thrush. No dental caries.  Has HFNC.    Neck: No masses. Midline trachea. No JVD, (-) LAD. (-) bruits appreciated.  Respiratory/Chest: Grossly normal chest. (-) deformity. (-) Accessory muscle use.  Symmetric expansion. (-) Tenderness on palpation.  Resonant on percussion.  Diminished BS on both lower lung zones. (-) wheezing,  Rhonchi. Has crackles bibasilar. (-) egophony  Cardiovascular: Regular rate and  rhythm, heart sounds normal, no murmur or gallops, no peripheral edema  Gastrointestinal:  Normal bowel sounds. Soft, non-tender. No hepatosplenomegaly.  (-) masses.   Musculoskeletal:  Normal muscle tone. Normal gait.   Extremities: Grossly normal. (-) clubbing, cyanosis.  (-)  edema  Skin: (-) rash,lesions seen.   Neurological/Psychiatric : alert, oriented to time, place, person. Normal mood and affect  CBC Recent Labs     05/01/16  0310  05/02/16  0223  05/03/16  0337  WBC  6.0  5.6  7.5  HGB  11.6*  10.8*  11.1*  HCT  32.9*  33.9*  34.7*  PLT  117*  114*  126*    Coag's No results for input(s): APTT, INR in the last 72 hours.  BMET Recent Labs     05/01/16  0310  05/02/16  0223  05/03/16  0203  NA  144  141  139  K  3.6  3.6  3.3*  CL  108  104  99*  CO2  _0 BUN  22*  22*  21*  CREATININE  0.66  0.53  0.61  GLUCOSE  109*  101*  88    Electrolytes Recent Labs     05/01/16  0310  05/02/16  0223  05/03/16  0203  CALCIUM  8.1*  7.9*  8.1*  MG  2.2  2.2  2.2  PHOS  2.2*   --   2.0*    Sepsis Markers Recent Labs     05/01/16  0310  PROCALCITON  2.34    ABG No results for input(s): PHART, PCO2ART, PO2ART in the last 72 hours.  Liver Enzymes Recent Labs     05/01/16  0310  AST  48*  ALT  77*  ALKPHOS  82  BILITOT  1.5*  ALBUMIN  2.3*    Cardiac Enzymes No results for input(s): TROPONINI, PROBNP in the last  72 hours.  Glucose Recent Labs     05/02/16  1659  05/02/16  1939  05/02/16  2344  05/03/16  0334  05/03/16  0825  05/03/16  1148  GLUCAP  146*  223*  111*  91  80  191*    Imaging Ct Chest Wo Contrast  Result Date: 05/02/2016 CLINICAL DATA:  Acute respiratory failure with hypoxia. EXAM: CT CHEST WITHOUT CONTRAST TECHNIQUE: Multidetector CT imaging of the chest was performed following the standard protocol without IV contrast. COMPARISON:  04/30/2016 FINDINGS: Cardiovascular: Coronary, aortic arch, and branch vessel atherosclerotic vascular disease. Mild cardiomegaly. Mediastinum/Nodes: Prevascular node 0.9 cm in short axis image 41/3. Right paratracheal node 0.8 cm in short axis image 51/3. Right hilar node 1.0 cm in short axis image 64/3. There appears to likely be left and right infrahilar lymph nodes of similar size. Subcarinal node 1.7 cm on image 73/3. Lungs/Pleura: Bilateral secondary pulmonary lobular septal accentuation with geographic ground-glass opacities especially in dependent portions of the lungs. More confluent but somewhat nodular airspace opacities in the lower lobes peripherally. Overall appearance bilaterally symmetric. Upper Abdomen: Unremarkable Musculoskeletal: Unremarkable IMPRESSION: 1. Diffuse bilateral interstitial opacity with geographic ground-glass opacities throughout the lungs favoring the lung bases, and some confluent dependent airspace opacities in the lower lobes. Secondary pulmonary lobular thickening. Recent echocardiography showed normal left ventricular function although overall there appears to be mild cardiomegaly on today' s exam. Possibilities with this pattern include nonspecific interstitial pneumonia, organizing pneumonia (bronchiolitis obliterans with organizing pneumonia), infection by certain agents such as viral or mycoplasma, pulmonary hemorrhage, drug reaction, alveolar proteinosis, or edema. Electronically Signed   By: Van Clines M.D.    On: 05/02/2016 10:01   Dg Chest Port 1 View  Result Date: 05/02/2016 CLINICAL DATA:  Acute respiratory failure with hypoxia EXAM:  PORTABLE CHEST 1 VIEW COMPARISON:  04/30/2016 FINDINGS: Prominent interstitial lung markings bilaterally show mild interval improvement. Bibasilar consolidation has progressed and likely is atelectasis. Small left effusion. IMPRESSION: Diffuse bilateral airspace disease with prominent interstitial component shows mild interval improvement. Possible edema or pneumonia. Progression of bibasilar consolidation left greater than right likely atelectasis. Small left effusion. Electronically Signed   By: Franchot Gallo M.D.   On: 05/02/2016 07:31    Assessment/Plan: Acute Hypoxemic Hypercapneic resp Fx 2/2 likely viral PNA +/- bacterial PNA + Volume overload + underlying untreated  COPD (currently smoking 1PPD) + Concern for underlying ILD. Clinically improved.  Plan to cut down Fio2 to keep sats > 88-90%. Finish 5days azuthromycin and 1 week rocephin. Diuresce as able.  F/U blood work to R/O CTD. So far blood work is (-). Aspiration precaution. Over all goal is to hopefully cut down O2.  If is is improved and able to go home, will need a rpt chest ct scan as f/u in 4-6 weeks.  If with persistent infiltrate, will need a surgical lung biopsy to determine exact cause of ILD.    Schizophrenia. Stable. Cont outpt meds.   Family :  No family at bedside.   On SDU.   Monica Becton, MD 05/03/2016, 12:16 PM [facility] Pager [phone] After 3 pm or if no answer, call [phone]

## 2016-05-03 NOTE — Progress Notes (Signed)
PROGRESS NOTE    Katherine [name]  AST:[id number] DOB: 1970-05-08 DOA: 04/29/2016 PCP: No primary care provider on file.   No chief complaint on file.    Brief Narrative:  46 year old female with PMH of ETOH, tobacco use, hyperlipidemia and schizophrenia, presents to [facility] on 1/25 with history of 4 days of fever, chills, cough, and body aches, on arrival to ED required NRB, CXR revealed diffuse bilateral interstitial opacities, ultimately transferred to [facility] for admit for PCCM.   Assessment & Plan   Acute hypoxic respiratory failure secondary to possible pneumonia versus viral illness -Patient currently on azithromycin and ceftriaxone -PCCM following -Continue supplemental oxygen to maintain saturations between 88-90% -Continue nebs, prednisone taper, BiPAP as needed -Autoimmune workup: ANA, ANCA, CCP, RF -ESR and CRP elevated -Respiratory viral panel negative -HIV and hep C negative -Per pulmonology, patient may have interstitial lung disease, she will need repeat follow-up CT scan in 4-6 weeks. If patient continues to have a persistent infiltrate, she will need surgical lung biopsy.  History of hyperlipidemia -Restart statin  Thrombocytopenia -Improving, currently platelets 126 -Possibly secondary to infection  Tobacco abuse -Counseled regarding smoking cessation  Possible COPD -Patient will need follow-up with pulmonology upon discharge  Schizophrenia/depression/anxiety -Continue Zoloft, Abilify -Continue hydroxyzine as needed  Alcohol abuse -Continue thiamine, folate, multivitamin  DVT Prophylaxis  heparin  Code Status: Full  Family Communication: None bedside  Disposition Plan: Admitted. Continue to monitor and stepdown  Consultants PCCM  Procedures  Echocardiogram LVEF 55%, normal wall motion  Antibiotics   Anti-infectives    Start     Dose/Rate Route Frequency Ordered Stop   04/29/16 1000  azithromycin (ZITHROMAX) 500 mg in dextrose 5 %  250 mL IVPB  Status:  Discontinued     500 mg 250 mL/hr over 60 Minutes Intravenous Every 24 hours 04/29/16 0852 05/03/16 1221   04/29/16 0900  cefTRIAXone (ROCEPHIN) 1 g in dextrose 5 % 50 mL IVPB     1 g 100 mL/hr over 30 Minutes Intravenous Every 24 hours 04/29/16 0856 05/05/16 2359      Subjective:   Katherine [name] seen and examined today.  Patient feels her breathing is improving. Denies any current chest pain, abdominal pain, nausea or vomiting, diarrhea constipation.    Objective:   Vitals:   05/03/16 1148 05/03/16 1200 05/03/16 1230 05/03/16 1300  BP:  101/65 101/65 99/80  Pulse:  61 69 81  Resp:  (!) 28 (!) 32 (!) 30  Temp: 98.9 F (37.2 C)     TempSrc: Oral     SpO2:  97% 94% 94%  Weight:      Height:        Intake/Output Summary (Last 24 hours) at 05/03/16 1406 Last data filed at 05/03/16 1300  Gross per 24 hour  Intake              670 ml  Output             1625 ml  Net             -955 ml   Filed Weights   05/01/16 0500 05/02/16 0500 05/03/16 0102  Weight: 74.1 kg (163 lb 5.8 oz) 74.1 kg (163 lb 5.8 oz) 71.2 kg (156 lb 15.5 oz)    Exam  General: Well developed, well nourished, NAD, appears stated age  HEENT: NCAT,  mucous membranes moist. Currently on High Flow [place]  Cardiovascular: S1 S2 auscultated, no rubs, murmurs or gallops. Regular rate  and rhythm.  Respiratory: Diminished breath sounds, bibasilar crackles  Abdomen: Soft, nontender, nondistended, + bowel sounds  Extremities: warm dry without cyanosis clubbing or edema  Neuro: AAOx3, nonfocal  Skin: Without rashes exudates or nodules, multiple tattoos   Psych: Normal affect and demeanor with intact judgement and insight   Data Reviewed: I have personally reviewed following labs and imaging studies  CBC:  Recent Labs Lab 04/29/16 0809 04/30/16 0221 05/01/16 0310 05/02/16 0223 05/03/16 0337  WBC 10.2 9.4 6.0 5.6 7.5  HGB 13.0 12.9 11.6* 10.8* 11.1*  HCT 38.7 38.8 32.9* 33.9*  34.7*  MCV 97.0 99.5 106.5* 98.8 98.9  PLT 95* 116* 117* 114* 654*   Basic Metabolic Panel:  Recent Labs Lab 04/29/16 0809 04/29/16 1054 04/30/16 0221 05/01/16 0310 05/02/16 0223 05/03/16 0203  NA 143  --  140 144 141 139  K 3.6  --  3.1* 3.6 3.6 3.3*  CL 110  --  106 108 104 99*  CO2 21*  --  _0 GLUCOSE 98  --  136* 109* 101* 88  BUN 15  --  21* 22* 22* 21*  CREATININE 0.73  --  0.87 0.66 0.53 0.61  CALCIUM 8.3*  --  8.2* 8.1* 7.9* 8.1*  MG  --  1.7 1.9 2.2 2.2 2.2  PHOS  --  3.3 2.5 2.2*  --  2.0*   GFR: Estimated Creatinine Clearance: 84.1 mL/min (by C-G formula based on SCr of 0.61 mg/dL). Liver Function Tests:  Recent Labs Lab 04/29/16 0809 04/30/16 0854 05/01/16 0310  AST 83* 78* 48*  ALT 136* 120* 77*  ALKPHOS 99 99 82  BILITOT 0.7 0.7 1.5*  PROT 6.0* 6.9 5.8*  ALBUMIN 2.6* 2.7* 2.3*   No results for input(s): LIPASE, AMYLASE in the last 168 hours. No results for input(s): AMMONIA in the last 168 hours. Coagulation Profile: No results for input(s): INR, PROTIME in the last 168 hours. Cardiac Enzymes:  Recent Labs Lab 04/29/16 1054 04/29/16 1406 04/29/16 2025  TROPONINI <0.03 <0.03 <0.03   BNP (last 3 results) No results for input(s): PROBNP in the last 8760 hours. HbA1C: No results for input(s): HGBA1C in the last 72 hours. CBG:  Recent Labs Lab 05/02/16 1939 05/02/16 2344 05/03/16 0334 05/03/16 0825 05/03/16 1148  GLUCAP 223* 111* 91 80 191*   Lipid Profile: No results for input(s): CHOL, HDL, LDLCALC, TRIG, CHOLHDL, LDLDIRECT in the last 72 hours. Thyroid Function Tests: No results for input(s): TSH, T4TOTAL, FREET4, T3FREE, THYROIDAB in the last 72 hours. Anemia Panel: No results for input(s): VITAMINB12, FOLATE, FERRITIN, TIBC, IRON, RETICCTPCT in the last 72 hours. Urine analysis:    Component Value Date/Time   COLORURINE AMBER (A) 05/02/2016 1336   APPEARANCEUR CLEAR 05/02/2016 1336   LABSPEC 1.017 05/02/2016 1336     PHURINE 9.0 (H) 05/02/2016 1336   GLUCOSEU NEGATIVE 05/02/2016 1336   HGBUR NEGATIVE 05/02/2016 1336   BILIRUBINUR SMALL (A) 05/02/2016 1336   KETONESUR NEGATIVE 05/02/2016 1336   PROTEINUR 30 (A) 05/02/2016 1336   NITRITE NEGATIVE 05/02/2016 1336   LEUKOCYTESUR NEGATIVE 05/02/2016 1336   Sepsis Labs: _1 (procalcitonin:4,lacticidven:4)  ) Recent Results (from the past 240 hour(s))  MRSA PCR Screening     Status: None   Collection Time: 04/29/16  6:38 AM  Result Value Ref Range Status   MRSA by PCR NEGATIVE NEGATIVE Final    Comment:        The GeneXpert MRSA Assay (FDA approved for NASAL specimens  only), is one component of a comprehensive MRSA colonization surveillance program. It is not intended to diagnose MRSA infection nor to guide or monitor treatment for MRSA infections.   Culture, blood (routine x 2)     Status: None (Preliminary result)   Collection Time: 04/29/16  8:45 AM  Result Value Ref Range Status   Specimen Description BLOOD LEFT HAND  Final   Special Requests IN PEDIATRIC BOTTLE 3CC  Final   Culture NO GROWTH 3 DAYS  Final   Report Status PENDING  Incomplete  Culture, blood (routine x 2)     Status: None (Preliminary result)   Collection Time: 04/29/16  8:48 AM  Result Value Ref Range Status   Specimen Description BLOOD RIGHT HAND  Final   Special Requests IN PEDIATRIC BOTTLE 2CC  Final   Culture NO GROWTH 3 DAYS  Final   Report Status PENDING  Incomplete  Respiratory Panel by PCR     Status: None   Collection Time: 04/29/16 10:18 AM  Result Value Ref Range Status   Adenovirus NOT DETECTED NOT DETECTED Final   Coronavirus 229E NOT DETECTED NOT DETECTED Final   Coronavirus HKU1 NOT DETECTED NOT DETECTED Final   Coronavirus NL63 NOT DETECTED NOT DETECTED Final   Coronavirus OC43 NOT DETECTED NOT DETECTED Final   Metapneumovirus NOT DETECTED NOT DETECTED Final   Rhinovirus / Enterovirus NOT DETECTED NOT DETECTED Final   Influenza A NOT  DETECTED NOT DETECTED Final   Influenza B NOT DETECTED NOT DETECTED Final   Parainfluenza Virus 1 NOT DETECTED NOT DETECTED Final   Parainfluenza Virus 2 NOT DETECTED NOT DETECTED Final   Parainfluenza Virus 3 NOT DETECTED NOT DETECTED Final   Parainfluenza Virus 4 NOT DETECTED NOT DETECTED Final   Respiratory Syncytial Virus NOT DETECTED NOT DETECTED Final   Bordetella pertussis NOT DETECTED NOT DETECTED Final   Chlamydophila pneumoniae NOT DETECTED NOT DETECTED Final   Mycoplasma pneumoniae NOT DETECTED NOT DETECTED Final  Culture, Urine     Status: None   Collection Time: 04/29/16  3:52 PM  Result Value Ref Range Status   Specimen Description URINE, RANDOM  Final   Special Requests NONE  Final   Culture NO GROWTH  Final   Report Status 04/30/2016 FINAL  Final      Radiology Studies: Ct Chest Wo Contrast  Result Date: 05/02/2016 CLINICAL DATA:  Acute respiratory failure with hypoxia. EXAM: CT CHEST WITHOUT CONTRAST TECHNIQUE: Multidetector CT imaging of the chest was performed following the standard protocol without IV contrast. COMPARISON:  04/30/2016 FINDINGS: Cardiovascular: Coronary, aortic arch, and branch vessel atherosclerotic vascular disease. Mild cardiomegaly. Mediastinum/Nodes: Prevascular node 0.9 cm in short axis image 41/3. Right paratracheal node 0.8 cm in short axis image 51/3. Right hilar node 1.0 cm in short axis image 64/3. There appears to likely be left and right infrahilar lymph nodes of similar size. Subcarinal node 1.7 cm on image 73/3. Lungs/Pleura: Bilateral secondary pulmonary lobular septal accentuation with geographic ground-glass opacities especially in dependent portions of the lungs. More confluent but somewhat nodular airspace opacities in the lower lobes peripherally. Overall appearance bilaterally symmetric. Upper Abdomen: Unremarkable Musculoskeletal: Unremarkable IMPRESSION: 1. Diffuse bilateral interstitial opacity with geographic ground-glass opacities  throughout the lungs favoring the lung bases, and some confluent dependent airspace opacities in the lower lobes. Secondary pulmonary lobular thickening. Recent echocardiography showed normal left ventricular function although overall there appears to be mild cardiomegaly on today' s exam. Possibilities with this pattern include nonspecific interstitial pneumonia, organizing  pneumonia (bronchiolitis obliterans with organizing pneumonia), infection by certain agents such as viral or mycoplasma, pulmonary hemorrhage, drug reaction, alveolar proteinosis, or edema. Electronically Signed   By: Van Clines M.D.   On: 05/02/2016 10:01   Dg Chest Port 1 View  Result Date: 05/02/2016 CLINICAL DATA:  Acute respiratory failure with hypoxia EXAM: PORTABLE CHEST 1 VIEW COMPARISON:  04/30/2016 FINDINGS: Prominent interstitial lung markings bilaterally show mild interval improvement. Bibasilar consolidation has progressed and likely is atelectasis. Small left effusion. IMPRESSION: Diffuse bilateral airspace disease with prominent interstitial component shows mild interval improvement. Possible edema or pneumonia. Progression of bibasilar consolidation left greater than right likely atelectasis. Small left effusion. Electronically Signed   By: Franchot Gallo M.D.   On: 05/02/2016 07:31     Scheduled Meds: . ARIPiprazole  5 mg Oral Daily  . cefTRIAXone (ROCEPHIN)  IV  1 g Intravenous Q24H  . feeding supplement (ENSURE ENLIVE)  237 mL Oral BID BM  . [START ON 8/36/7255] folic acid  1 mg Oral Daily  . heparin  5,000 Units Subcutaneous Q8H  . insulin aspart  0-9 Units Subcutaneous Q4H  . ipratropium-albuterol  3 mL Nebulization Q6H  . mouth rinse  15 mL Mouth Rinse BID  . potassium chloride  40 mEq Oral BID  . predniSONE  40 mg Oral Q breakfast  . sertraline  25 mg Oral Daily  . [START ON 05/04/2016] thiamine  100 mg Oral Daily   Continuous Infusions:   LOS: 4 days   Time Spent in minutes   30  minutes  Corday Wyka D.O. on 05/03/2016 at 2:06 PM  Between 7am to 7pm - Pager - [phone]  After 7pm go to [email or web address] - password [email or web address]  And look for the night coverage person covering for me after hours  [facility] Office  [phone]

## 2016-05-04 DIAGNOSIS — D696 Thrombocytopenia, unspecified: Secondary | ICD-10-CM

## 2016-05-04 DIAGNOSIS — Z720 Tobacco use: Secondary | ICD-10-CM

## 2016-05-04 DIAGNOSIS — R0902 Hypoxemia: Secondary | ICD-10-CM | POA: Insufficient documentation

## 2016-05-04 LAB — CULTURE, BLOOD (ROUTINE X 2)
Culture: NO GROWTH
Culture: NO GROWTH

## 2016-05-04 LAB — CBC
HCT: 32.7 % — ABNORMAL LOW (ref 36.0–46.0)
Hemoglobin: 11.2 g/dL — ABNORMAL LOW (ref 12.0–15.0)
MCH: 36 pg — ABNORMAL HIGH (ref 26.0–34.0)
MCHC: 34.3 g/dL (ref 30.0–36.0)
MCV: 105.1 fL — ABNORMAL HIGH (ref 78.0–100.0)
PLATELETS: 139 10*3/uL — AB (ref 150–400)
RBC: 3.11 MIL/uL — AB (ref 3.87–5.11)
RDW: 19.4 % — ABNORMAL HIGH (ref 11.5–15.5)
WBC: 7.9 10*3/uL (ref 4.0–10.5)

## 2016-05-04 LAB — BASIC METABOLIC PANEL
ANION GAP: 7 (ref 5–15)
BUN: 21 mg/dL — AB (ref 6–20)
CALCIUM: 8.1 mg/dL — AB (ref 8.9–10.3)
CO2: 31 mmol/L (ref 22–32)
Chloride: 99 mmol/L — ABNORMAL LOW (ref 101–111)
Creatinine, Ser: 0.63 mg/dL (ref 0.44–1.00)
GFR calc Af Amer: >60 mL/min (ref >60)
Glucose, Bld: 67 mg/dL (ref 65–99)
Potassium: 3.4 mmol/L — ABNORMAL LOW (ref 3.5–5.1)
SODIUM: 137 mmol/L (ref 135–145)

## 2016-05-04 LAB — GLUCOSE, CAPILLARY
GLUCOSE-CAPILLARY: 133 mg/dL — AB (ref 65–99)
GLUCOSE-CAPILLARY: 284 mg/dL — AB (ref 65–99)
GLUCOSE-CAPILLARY: 73 mg/dL (ref 65–99)
GLUCOSE-CAPILLARY: 75 mg/dL (ref 65–99)
Glucose-Capillary: 89 mg/dL (ref 65–99)
Glucose-Capillary: 154 mg/dL — ABNORMAL HIGH (ref 65–99)

## 2016-05-04 LAB — ANA W/REFLEX IF POSITIVE: ANA: NEGATIVE

## 2016-05-04 LAB — MPO/PR-3 (ANCA) ANTIBODIES: Myeloperoxidase Abs: <9 U/mL (ref 0.0–9.0)

## 2016-05-04 LAB — RHEUMATOID FACTOR: Rhuematoid fact SerPl-aCnc: 28.3 IU/mL — ABNORMAL HIGH (ref 0.0–13.9)

## 2016-05-04 LAB — CYCLIC CITRUL PEPTIDE ANTIBODY, IGG/IGA: CCP ANTIBODIES IGG/IGA: 10 U (ref 0–19)

## 2016-05-04 LAB — MAGNESIUM: Magnesium: 2 mg/dL (ref 1.7–2.4)

## 2016-05-04 MED ORDER — FUROSEMIDE 10 MG/ML IJ SOLN
20.0000 mg | Freq: Once | INTRAMUSCULAR | Status: AC
  Administered 2016-05-04: 20 mg via INTRAVENOUS
  Filled 2016-05-04: qty 2

## 2016-05-04 MED ORDER — SIMVASTATIN 20 MG PO TABS
20.0000 mg | ORAL_TABLET | Freq: Every day | ORAL | Status: DC
  Administered 2016-05-04 – 2016-05-06 (×3): 20 mg via ORAL
  Filled 2016-05-04 (×4): qty 1

## 2016-05-04 MED ORDER — POTASSIUM CHLORIDE CRYS ER 20 MEQ PO TBCR
40.0000 meq | EXTENDED_RELEASE_TABLET | Freq: Once | ORAL | Status: AC
  Administered 2016-05-04: 40 meq via ORAL
  Filled 2016-05-04: qty 2

## 2016-05-04 NOTE — Progress Notes (Addendum)
PROGRESS NOTE    Katherine [name]  HUT:[id number] DOB: 1970-07-20 DOA: 04/29/2016 PCP: No primary care provider on file.   No chief complaint on file.    Brief Narrative:  46 year old female with PMH of ETOH, tobacco use, hyperlipidemia and schizophrenia, presents to [facility] on 1/25 with history of 4 days of fever, chills, cough, and body aches, on arrival to ED required NRB, CXR revealed diffuse bilateral interstitial opacities, ultimately transferred to [facility] for admit for PCCM.   Transferred to stepdown. Pulmonology still following.  Still on High flow .  Assessment & Plan   Acute hypoxic respiratory failure secondary to possible pneumonia versus viral illness -Patient currently on azithromycin and ceftriaxone -PCCM following -Continue supplemental oxygen to maintain saturations between 88-90%- currently on high flow. Will attempt to wean.  -Continue nebs, prednisone taper, BiPAP as needed -Autoimmune workup: ANA, ANCA, CCP, RF pending  -ESR and CRP elevated -Respiratory viral panel negative -HIV and hep C negative -Per pulmonology, patient may have interstitial lung disease, she will need repeat follow-up CT scan in 4-6 weeks. If patient continues to have a persistent infiltrate, she will need surgical lung biopsy. -Last CXR showed possible edema vs pneumonia -Will continue IV lasix as BP permits  -Echocardiogram EF 55-60%, no RWMA, LV diastolic function normal  History of hyperlipidemia -Restart statin  Thrombocytopenia -Improving, currently platelets 139 -Possibly secondary to infection  Tobacco abuse -Counseled regarding smoking cessation  Possible COPD -Patient will need follow-up with pulmonology upon discharge  Schizophrenia/depression/anxiety -Continue Zoloft, Abilify -Continue hydroxyzine as needed  Alcohol abuse -Continue thiamine, folate, multivitamin  DVT Prophylaxis  heparin  Code Status: Full  Family Communication: None  bedside  Disposition Plan: Admitted. Continue to monitor and stepdown as patient is requiring high flow oxygen  Consultants PCCM  Procedures  Echocardiogram LVEF 55%, normal wall motion  Antibiotics   Anti-infectives    Start     Dose/Rate Route Frequency Ordered Stop   04/29/16 1000  azithromycin (ZITHROMAX) 500 mg in dextrose 5 % 250 mL IVPB  Status:  Discontinued     500 mg 250 mL/hr over 60 Minutes Intravenous Every 24 hours 04/29/16 0852 05/03/16 1221   04/29/16 0900  cefTRIAXone (ROCEPHIN) 1 g in dextrose 5 % 50 mL IVPB     1 g 100 mL/hr over 30 Minutes Intravenous Every 24 hours 04/29/16 0856 05/05/16 2359      Subjective:   [name] seen and examined today.  Patient feels her breathing is improving.  Continues to have productive cough. Denies any current chest pain, abdominal pain, nausea or vomiting, diarrhea constipation.    Objective:   Vitals:   05/04/16 0800 05/04/16 0814 05/04/16 0836 05/04/16 0900  BP: (!) 88/47   (!) 91/48  Pulse: 90 91  83  Resp: (!) 23 16  (!) 21  Temp:   99.8 F (37.7 C)   TempSrc:   Oral   SpO2: 93% 95%  91%  Weight:      Height:        Intake/Output Summary (Last 24 hours) at 05/04/16 1042 Last data filed at 05/04/16 1000  Gross per 24 hour  Intake              984 ml  Output              250 ml  Net              734 ml   [employer]  05/02/16 0500 05/03/16 0102 05/04/16 0500  Weight: 74.1 kg (163 lb 5.8 oz) 71.2 kg (156 lb 15.5 oz) 69.4 kg (153 lb)    Exam  General: Well developed, well nourished, NAD, appears stated age  28: NCAT,  mucous membranes moist. Currently on High Flow [place]  Cardiovascular: S1 S2 auscultated, no murmurs, RRR  Respiratory: Diminished breath sounds, bibasilar crackles  Abdomen: Soft, nontender, nondistended, + bowel sounds  Extremities: warm dry without cyanosis clubbing or edema  Neuro: AAOx3, nonfocal  Skin: Without rashes exudates or nodules, multiple tattoos   Psych:  Normal affect and demeanor, pleasant   Data Reviewed: I have personally reviewed following labs and imaging studies  CBC:  Recent Labs Lab 04/30/16 0221 05/01/16 0310 05/02/16 0223 05/03/16 0337 05/04/16 0209  WBC 9.4 6.0 5.6 7.5 7.9  HGB 12.9 11.6* 10.8* 11.1* 11.2*  HCT 38.8 32.9* 33.9* 34.7* 32.7*  MCV 99.5 106.5* 98.8 98.9 105.1*  PLT 116* 117* 114* 126* 563*   Basic Metabolic Panel:  Recent Labs Lab 04/29/16 1054 04/30/16 0221 05/01/16 0310 05/02/16 0223 05/03/16 0203 05/04/16 0209 05/04/16 0828  NA  --  140 144 141 139 137  --   K  --  3.1* 3.6 3.6 3.3* 3.4*  --   CL  --  106 108 104 99* 99*  --   CO2  --  _0 --   GLUCOSE  --  136* 109* 101* 88 67  --   BUN  --  21* 22* 22* 21* 21*  --   CREATININE  --  0.87 0.66 0.53 0.61 0.63  --   CALCIUM  --  8.2* 8.1* 7.9* 8.1* 8.1*  --   MG 1.7 1.9 2.2 2.2 2.2  --  2.0  PHOS 3.3 2.5 2.2*  --  2.0*  --   --    GFR: Estimated Creatinine Clearance: 83.1 mL/min (by C-G formula based on SCr of 0.63 mg/dL). Liver Function Tests:  Recent Labs Lab 04/29/16 0809 04/30/16 0854 05/01/16 0310  AST 83* 78* 48*  ALT 136* 120* 77*  ALKPHOS 99 99 82  BILITOT 0.7 0.7 1.5*  PROT 6.0* 6.9 5.8*  ALBUMIN 2.6* 2.7* 2.3*   No results for input(s): LIPASE, AMYLASE in the last 168 hours. No results for input(s): AMMONIA in the last 168 hours. Coagulation Profile: No results for input(s): INR, PROTIME in the last 168 hours. Cardiac Enzymes:  Recent Labs Lab 04/29/16 1054 04/29/16 1406 04/29/16 2025  TROPONINI <0.03 <0.03 <0.03   BNP (last 3 results) No results for input(s): PROBNP in the last 8760 hours. HbA1C: No results for input(s): HGBA1C in the last 72 hours. CBG:  Recent Labs Lab 05/03/16 1531 05/03/16 1938 05/04/16 0026 05/04/16 0415 05/04/16 0827  GLUCAP 220* 182* 73 75 89   Lipid Profile: No results for input(s): CHOL, HDL, LDLCALC, TRIG, CHOLHDL, LDLDIRECT in the last 72 hours. Thyroid  Function Tests: No results for input(s): TSH, T4TOTAL, FREET4, T3FREE, THYROIDAB in the last 72 hours. Anemia Panel: No results for input(s): VITAMINB12, FOLATE, FERRITIN, TIBC, IRON, RETICCTPCT in the last 72 hours. Urine analysis:    Component Value Date/Time   COLORURINE AMBER (A) 05/02/2016 1336   APPEARANCEUR CLEAR 05/02/2016 1336   LABSPEC 1.017 05/02/2016 1336   PHURINE 9.0 (H) 05/02/2016 1336   GLUCOSEU NEGATIVE 05/02/2016 1336   HGBUR NEGATIVE 05/02/2016 1336   BILIRUBINUR SMALL (A) 05/02/2016 1336   KETONESUR NEGATIVE 05/02/2016 1336   PROTEINUR 30 (  A) 05/02/2016 1336   NITRITE NEGATIVE 05/02/2016 1336   LEUKOCYTESUR NEGATIVE 05/02/2016 1336   Sepsis Labs: _0 (procalcitonin:4,lacticidven:4)  ) Recent Results (from the past 240 hour(s))  MRSA PCR Screening     Status: None   Collection Time: 04/29/16  6:38 AM  Result Value Ref Range Status   MRSA by PCR NEGATIVE NEGATIVE Final    Comment:        The GeneXpert MRSA Assay (FDA approved for NASAL specimens only), is one component of a comprehensive MRSA colonization surveillance program. It is not intended to diagnose MRSA infection nor to guide or monitor treatment for MRSA infections.   Culture, blood (routine x 2)     Status: None (Preliminary result)   Collection Time: 04/29/16  8:45 AM  Result Value Ref Range Status   Specimen Description BLOOD LEFT HAND  Final   Special Requests IN PEDIATRIC BOTTLE 3CC  Final   Culture NO GROWTH 4 DAYS  Final   Report Status PENDING  Incomplete  Culture, blood (routine x 2)     Status: None (Preliminary result)   Collection Time: 04/29/16  8:48 AM  Result Value Ref Range Status   Specimen Description BLOOD RIGHT HAND  Final   Special Requests IN PEDIATRIC BOTTLE 2CC  Final   Culture NO GROWTH 4 DAYS  Final   Report Status PENDING  Incomplete  Respiratory Panel by PCR     Status: None   Collection Time: 04/29/16 10:18 AM  Result Value Ref Range Status    Adenovirus NOT DETECTED NOT DETECTED Final   Coronavirus 229E NOT DETECTED NOT DETECTED Final   Coronavirus HKU1 NOT DETECTED NOT DETECTED Final   Coronavirus NL63 NOT DETECTED NOT DETECTED Final   Coronavirus OC43 NOT DETECTED NOT DETECTED Final   Metapneumovirus NOT DETECTED NOT DETECTED Final   Rhinovirus / Enterovirus NOT DETECTED NOT DETECTED Final   Influenza A NOT DETECTED NOT DETECTED Final   Influenza B NOT DETECTED NOT DETECTED Final   Parainfluenza Virus 1 NOT DETECTED NOT DETECTED Final   Parainfluenza Virus 2 NOT DETECTED NOT DETECTED Final   Parainfluenza Virus 3 NOT DETECTED NOT DETECTED Final   Parainfluenza Virus 4 NOT DETECTED NOT DETECTED Final   Respiratory Syncytial Virus NOT DETECTED NOT DETECTED Final   Bordetella pertussis NOT DETECTED NOT DETECTED Final   Chlamydophila pneumoniae NOT DETECTED NOT DETECTED Final   Mycoplasma pneumoniae NOT DETECTED NOT DETECTED Final  Culture, Urine     Status: None   Collection Time: 04/29/16  3:52 PM  Result Value Ref Range Status   Specimen Description URINE, RANDOM  Final   Special Requests NONE  Final   Culture NO GROWTH  Final   Report Status 04/30/2016 FINAL  Final      Radiology Studies: No results found.   Scheduled Meds: . ARIPiprazole  5 mg Oral Daily  . cefTRIAXone (ROCEPHIN)  IV  1 g Intravenous Q24H  . feeding supplement (ENSURE ENLIVE)  237 mL Oral BID BM  . folic acid  1 mg Oral Daily  . furosemide  20 mg Intravenous Once  . heparin  5,000 Units Subcutaneous Q8H  . insulin aspart  0-9 Units Subcutaneous Q4H  . ipratropium-albuterol  3 mL Nebulization Q6H  . mouth rinse  15 mL Mouth Rinse BID  . predniSONE  40 mg Oral Q breakfast  . sertraline  25 mg Oral Daily  . thiamine  100 mg Oral Daily   Continuous Infusions:   LOS: 5  days   Time Spent in minutes   30 minutes  Tamyka Bezio D.O. on 05/04/2016 at 10:42 AM  Between 7am to 7pm - Pager - [phone]  After 7pm go to [email or web address] -  password [email or web address]  And look for the night coverage person covering for me after hours  [facility] Office  [phone]

## 2016-05-04 NOTE — Progress Notes (Signed)
RT transported pt from 2M14 to 4N01 on NRB with HFNC on standby. Pt placed back on HFNC on previous settings with no complications. Pt stable throughout. RT will continue to monitor.

## 2016-05-04 NOTE — Progress Notes (Signed)
Pt has been titrated to a [place] 12L. Pt tolerating it well. No distress noted. Pt SATs are stable

## 2016-05-04 NOTE — Progress Notes (Signed)
PULMONARY / CRITICAL CARE MEDICINE   Name: Katherine [name] MRN: [id number] DOB: 1970/08/11    ADMISSION DATE:  04/29/2016 CONSULTATION DATE:  04/29/2016  REFERRING MD:  Dr. Gypsy Balsam, [place]:  Dyspnea   BRIEF SUMMARY:  46 year old female with PMH of ETOH, tobacco use, hyperlipidemia and schizophrenia, presents to [facility] on 1/25 with history of 4 days of fever, chills, cough, and body aches, on arrival to ED required NRB, CXR revealed diffuse bilateral interstitial opacities, ultimately transferred to [facility] for admit for PCCM.   SUBJECTIVE: She feels well this morning. She says her breathing is improving.   VITAL SIGNS: BP (!) 102/55   Pulse 87   Temp 99.5 F (37.5 C) (Oral)   Resp (!) 28   Ht 5' 3.5" (1.613 m)   Wt 153 lb (69.4 kg)   SpO2 94%   BMI 26.68 kg/m    VENTILATOR SETTINGS: FiO2 (%):  [50 %-60 %] 50 %  INTAKE / OUTPUT: I/O last 3 completed shifts: In: 954 [P.O.:444; I.V.:210; IV Piggyback:300] Out: 800 [Urine:800]  PHYSICAL EXAMINATION: General Apperance: NAD HEENT: Normocephalic, atraumatic, anicteric sclera Neck: Supple, trachea midline Lungs: Clear to auscultation bilaterally. No wheezes, rhonchi or rales. Breathing comfortably on HF [place] Heart: Regular rate and rhythm, no murmur/rub/gallop Abdomen: Soft, nontender, nondistended, no rebound/guarding Extremities: Warm and well perfused, no edema Skin: No rashes or lesions Neurologic: Alert and interactive. No gross deficits.   LABS:  BMET  Recent Labs Lab 05/02/16 0223 05/03/16 0203 05/04/16 0209  NA 141 139 137  K 3.6 3.3* 3.4*  CL 104 99* 99*  CO2 _0 BUN 22* 21* 21*  CREATININE 0.53 0.61 0.63  GLUCOSE 101* 88 67    Electrolytes  Recent Labs Lab 04/30/16 0221 05/01/16 0310 05/02/16 0223 05/03/16 0203 05/04/16 0209  CALCIUM 8.2* 8.1* 7.9* 8.1* 8.1*  MG 1.9 2.2 2.2 2.2  --   PHOS 2.5 2.2*  --  2.0*  --      CBC  Recent Labs Lab 05/02/16 0223 05/03/16 0337 05/04/16 0209  WBC 5.6 7.5 7.9  HGB 10.8* 11.1* 11.2*  HCT 33.9* 34.7* 32.7*  PLT 114* 126* 139*    Coag's No results for input(s): APTT, INR in the last 168 hours.  Sepsis Markers  Recent Labs Lab 04/29/16 0835 04/29/16 1054 04/30/16 0221 05/01/16 0310  LATICACIDVEN 1.0 1.3  --   --   PROCALCITON 4.27  --  4.67 2.34    ABG  Recent Labs Lab 04/30/16 0231 04/30/16 0400  PHART 7.359 7.365  PCO2ART 46.8 49.2*  PO2ART 68.4* 77.3*    Liver Enzymes  Recent Labs Lab 04/29/16 0809 04/30/16 0854 05/01/16 0310  AST 83* 78* 48*  ALT 136* 120* 77*  ALKPHOS 99 99 82  BILITOT 0.7 0.7 1.5*  ALBUMIN 2.6* 2.7* 2.3*    Cardiac Enzymes  Recent Labs Lab 04/29/16 1054 04/29/16 1406 04/29/16 2025  TROPONINI <0.03 <0.03 <0.03    Glucose  Recent Labs Lab 05/03/16 0825 05/03/16 1148 05/03/16 1531 05/03/16 1938 05/04/16 0026 05/04/16 0415  GLUCAP 80 191* 220* 182* 73 75    Imaging No results found.   STUDIES:  CXR 1/25 > diffuse airspace disease consistent with diffuse PNA vs ARDS  CXR 1/26 > slight interval improvement of diffuse airspace disease Echo 1/26 > LVEF 55%, normal wall motion CT chest 1/28 > Diffuse bilateral interstitial opacity with geographic ground-glass opacities throughout the lungs  favoring the lung bases, and some confluent dependent airspace opacities in the lower lobes. Secondary pulmonary lobular thickening.   CULTURES: Blood 1/25 > NGTD Sputum 1/25 > none collected RVP 1/25 > negative  Urine 1/25 > negative  ANTIBIOTICS: Azithromycin 1/25 > Rocephin 1/25 >   SIGNIFICANT EVENTS: 1/25  Presents to OSH with severe respiratory distress  1/26 1L IV fluids for mild hypotension overnight > tachypnea.  Then lasix + BiPAP with improvement.   LINES/TUBES: PIV  DISCUSSION: 46 year old woman presents to ED with 4 day history of fever, chills, and cough. Subjectively  concerning for flu with pneumonitis.  CXR reveals bilateral diffuse interstitial opacities.   ASSESSMENT / PLAN:  PULMONARY A: Acute Hypoxic Respiratory Failure  Diffuse Bilateral Interstitial Airspace Disease Tobacco Abuse - began age 57, 1ppd P:   Continue to wean O2 for sats >90%.  Pulmonary hygiene - IS, mobilize Duoneb Q6 + PRN albuterol  Prednisone taper Delsym PRN Serologies sent for autoimmune work up pending: ANA, ANCA, CCP, RF. ESR and CRP elevated. May consider further diuresis as BUN/Cr stable.  INFECTIOUS A:   Suspected Bacterial PNA Hx Flu-like Symptoms PTA - 2 wks illness RVP neg P:    HIV/Hep C negative on admit Azithromycin 1/25 > Rocephin 1/25 >   Jacques Earthly, MD  Internal Medicine PGY-3 Pager: [phone] If no answer [phone] 05/04/2016, 7:54 AM  ATTENDING NOTE / ATTESTATION NOTE :   I have discussed the case with the resident/APP  Dr. Jacques Earthly.    I agree with the resident/APP's  history, physical examination, assessment, and plans.    I have edited the above note and modified it according to our agreed history, physical examination, assessment and plan.   46 year old female with PMH of ETOH, tobacco use, hyperlipidemia and schizophrenia, presents to [facility] on 1/25 with history of 4 days of fever, chills, cough, and body aches, on arrival to ED required NRB, CXR revealed diffuse bilateral interstitial opacities, ultimately transferred to [facility] for admit for PCCM. Pt has been weaned off from BipaP. Slowly improving. On HFNC but at 50% now 25fom 70-100%), 15LPM.     Vitals:  Vitals:   05/04/16 1217 05/04/16 1306 05/04/16 1325 05/04/16 1500  BP:    (!) 95/56  Pulse:    62  Resp:    17  Temp: 98.4 F (36.9 C)   98 F (36.7 C)  TempSrc: Oral   Oral  SpO2:  99% 95% 94%  Weight:      Height:        Constitutional/General: well-nourished, well-developed, not in any distress  Body mass index is 26.68 kg/m. Wt Readings from  Last 3 Encounters:  05/04/16 69.4 kg (153 lb)    HEENT: PERLA, anicteric sclerae. (-) Oral thrush.HFNC.   Neck: No masses. Midline trachea. No JVD, (-) LAD. (-) bruits appreciated.  Respiratory/Chest: Grossly normal chest. (-) deformity. (-) Accessory muscle use.  Symmetric expansion. Diminished BS on both lower lung zones. (-) wheezing, rhonchi. Crackles at bases.  (-) egophony  Cardiovascular: Regular rate and  rhythm, heart sounds normal, no murmur or gallops,  Trace peripheral edema  Gastrointestinal:  Normal bowel sounds. Soft, non-tender. No hepatosplenomegaly.  (-) masses.   Musculoskeletal:  Normal muscle tone.   Extremities: Grossly normal. (-) clubbing, cyanosis.  (-) edema  Skin: (-) rash,lesions seen.   Neurological/Psychiatric : sedated, intubated. CN grossly intact. (-) lateralizing signs.     CBC Recent Labs     05/02/16  0223  05/03/16  0337  05/04/16  0209  WBC  5.6  7.5  7.9  HGB  10.8*  11.1*  11.2*  HCT  33.9*  34.7*  32.7*  PLT  114*  126*  139*    Coag's No results for input(s): APTT, INR in the last 72 hours.  BMET Recent Labs     05/02/16  0223  05/03/16  0203  05/04/16  0209  NA  141  139  137  K  3.6  3.3*  3.4*  CL  104  99*  99*  CO2  _0 BUN  22*  21*  21*  CREATININE  0.53  0.61  0.63  GLUCOSE  101*  88  67    Electrolytes Recent Labs     05/02/16  0223  05/03/16  0203  05/04/16  0209  05/04/16  0828  CALCIUM  7.9*  8.1*  8.1*   --   MG  2.2  2.2   --   2.0  PHOS   --   2.0*   --    --     Sepsis Markers No results for input(s): PROCALCITON, O2SATVEN in the last 72 hours.  Invalid input(s): LACTICACIDVEN  ABG No results for input(s): PHART, PCO2ART, PO2ART in the last 72 hours.  Liver Enzymes No results for input(s): AST, ALT, ALKPHOS, BILITOT, ALBUMIN in the last 72 hours.  Cardiac Enzymes No results for input(s): TROPONINI, PROBNP in the last 72 hours.  Glucose Recent Labs     05/03/16   1938  05/04/16  0026  05/04/16  0415  05/04/16  0827  05/04/16  1215  05/04/16  1641  GLUCAP  182*  73  75  89  154*  284*    Imaging No results found.  Assessment/Plan: Acute Hypoxemic Hypercapneic resp Fx 2/2 likely viral PNA +/- bacterial PNA + Volume overload + underlying untreated  COPD (currently smoking 1PPD) + Concern for underlying ILD. Clinically improved.   >Plan to cut down Fio2 to keep sats > 88-90%.  > Finish 5days azithromycin and 1 week rocephin.  > Diuresce as able.  F/U blood work to R/O CTD. So far blood work is (-).  > Aspiration precaution.  > Over all goal is to hopefully cut down O2.  If  She  is improved and able to go home, will need a rpt chest ct scan as f/u in 4-6 weeks.  If with persistent infiltrate, will need a surgical lung biopsy to determine exact cause of ILD.    Schizophrenia. Stable. Cont outpt meds.   Family :  Family updated at bedside.      Monica Becton, MD 05/04/2016, 6:14 PM [place] [facility] Pager [phone] After 3 pm or if no answer, call [phone]

## 2016-05-04 NOTE — Progress Notes (Signed)
Will attempt weaning off HFNC throughout the night IF tolerated. As of now patient o2 status is not tolerating wean. Pt has poor o2 reserve at this time. Pt is stable at this time no complications noted.

## 2016-05-05 ENCOUNTER — Inpatient Hospital Stay (HOSPITAL_COMMUNITY): Payer: Medicaid Other

## 2016-05-05 DIAGNOSIS — R509 Fever, unspecified: Secondary | ICD-10-CM | POA: Insufficient documentation

## 2016-05-05 DIAGNOSIS — R0600 Dyspnea, unspecified: Secondary | ICD-10-CM

## 2016-05-05 DIAGNOSIS — R918 Other nonspecific abnormal finding of lung field: Secondary | ICD-10-CM

## 2016-05-05 DIAGNOSIS — R0603 Acute respiratory distress: Secondary | ICD-10-CM | POA: Insufficient documentation

## 2016-05-05 LAB — URINALYSIS, ROUTINE W REFLEX MICROSCOPIC
Bilirubin Urine: NEGATIVE
GLUCOSE, UA: NEGATIVE mg/dL
HGB URINE DIPSTICK: NEGATIVE
Ketones, ur: NEGATIVE mg/dL
LEUKOCYTES UA: NEGATIVE
Nitrite: NEGATIVE
Protein, ur: NEGATIVE mg/dL
SPECIFIC GRAVITY, URINE: 1.019 (ref 1.005–1.030)
pH: 6 (ref 5.0–8.0)

## 2016-05-05 LAB — ANCA TITERS
Atypical P-ANCA titer: <1:20 {titer}
P-ANCA: <1:20 {titer}

## 2016-05-05 LAB — GLUCOSE, CAPILLARY
Glucose-Capillary: 101 mg/dL — ABNORMAL HIGH (ref 65–99)
Glucose-Capillary: 86 mg/dL (ref 65–99)
Glucose-Capillary: 92 mg/dL (ref 65–99)

## 2016-05-05 LAB — CBC
HCT: 34.7 % — ABNORMAL LOW (ref 36.0–46.0)
Hemoglobin: 11.2 g/dL — ABNORMAL LOW (ref 12.0–15.0)
MCH: 32.1 pg (ref 26.0–34.0)
MCHC: 32.3 g/dL (ref 30.0–36.0)
MCV: 99.4 fL (ref 78.0–100.0)
PLATELETS: 143 10*3/uL — AB (ref 150–400)
RBC: 3.49 MIL/uL — AB (ref 3.87–5.11)
RDW: 17.5 % — ABNORMAL HIGH (ref 11.5–15.5)
WBC: 8.1 10*3/uL (ref 4.0–10.5)

## 2016-05-05 LAB — BASIC METABOLIC PANEL
ANION GAP: 9 (ref 5–15)
BUN: 23 mg/dL — AB (ref 6–20)
CHLORIDE: 93 mmol/L — AB (ref 101–111)
CO2: 32 mmol/L (ref 22–32)
Calcium: 8.4 mg/dL — ABNORMAL LOW (ref 8.9–10.3)
Creatinine, Ser: 0.67 mg/dL (ref 0.44–1.00)
Glucose, Bld: 76 mg/dL (ref 65–99)
POTASSIUM: 4.1 mmol/L (ref 3.5–5.1)
SODIUM: 134 mmol/L — AB (ref 135–145)

## 2016-05-05 NOTE — Progress Notes (Addendum)
PULMONARY / CRITICAL CARE MEDICINE   Name: Katherine [name] MRN: [id number] DOB: May 15, 1970    ADMISSION DATE:  04/29/2016 CONSULTATION DATE:  04/29/2016  REFERRING MD:  Dr. Carver FilaFrisco, [facility]   CHIEF COMPLAINT:  Dyspnea   BRIEF SUMMARY:  46 year old female with PMH of ETOH, tobacco use, hyperlipidemia and schizophrenia, presents to [facility] on 1/25 with history of 4 days of fever, chills, cough, and body aches, on arrival to ED required NRB, CXR revealed diffuse bilateral interstitial opacities, [facility] for PCCM.   SUBJECTIVE:  Looks good Feeling better daily   VITAL SIGNS: BP (!) 95/54 (BP Location: Right Arm)   Pulse 72   Temp 99.4 F (37.4 C) (Oral)   Resp 16   Ht 5' 3.5" (1.613 m)   Wt 156 lb (70.8 kg)   SpO2 92%   BMI 27.20 kg/m  Oxygen: 12 liter HF   INTAKE / OUTPUT:  Intake/Output Summary (Last 24 hours) at 05/05/16 [id number] Last data filed at 05/04/16 2328  Gross per 24 hour  Intake              880 ml  Output              800 ml  Net               80 ml    General appearance:  46 Year old female, well nourished  NAD, conversant  Eyes: anicteric moist conjunctivae; Mouth:  membranes and no mucosal ulcerations; normal hard and soft palate Neck: Trachea midline; neck supple, no JVD Lungs/chest: faint posterior crackles in both bases, with normal respiratory effort and no intercostal retractions CV: RRR, no MRGs  Abdomen: Soft, non-tender; no masses or HSM Extremities: No peripheral edema or extremity lymphadenopathy Skin: Normal temperature, turgor and texture; no rash, ulcers or subcutaneous nodules Neuro/Psych: Appropriate affect, alert and oriented to person, place and time  LABS:  BMET  Recent Labs Lab 05/03/16 0203 05/04/16 0209 05/05/16 0413  NA 139 137 134*  K 3.3* 3.4* 4.1  CL 99* 99* 93*  CO2 31 31 32  BUN 21* 21* 23*  CREATININE 0.61 0.63 0.67  GLUCOSE 88 67 76     Electrolytes  Recent Labs Lab 04/30/16 0221 05/01/16 0310 05/02/16 0223 05/03/16 0203 05/04/16 0209 05/04/16 0828 05/05/16 0413  CALCIUM 8.2* 8.1* 7.9* 8.1* 8.1*  --  8.4*  MG 1.9 2.2 2.2 2.2  --  2.0  --   PHOS 2.5 2.2*  --  2.0*  --   --   --     CBC  Recent Labs Lab 05/03/16 0337 05/04/16 0209 05/05/16 0413  WBC 7.5 7.9 8.1  HGB 11.1* 11.2* 11.2*  HCT 34.7* 32.7* 34.7*  PLT 126* 139* 143*    Coag's No results for input(s): APTT, INR in the last 168 hours.  Sepsis Markers  Recent Labs Lab 04/29/16 0835 04/29/16 1054 04/30/16 0221 05/01/16 0310  LATICACIDVEN 1.0 1.3  --   --   PROCALCITON 4.27  --  4.67 2.34    ABG  Recent Labs Lab 04/30/16 0231 04/30/16 0400  PHART 7.359 7.365  PCO2ART 46.8 49.2*  PO2ART 68.4* 77.3*    Liver Enzymes  Recent Labs Lab 04/29/16 0809 04/30/16 0854 05/01/16 0310  AST 83* 78* 48*  ALT 136* 120* 77*  ALKPHOS 99 99 82  BILITOT 0.7 0.7 1.5*  ALBUMIN 2.6* 2.7* 2.3*    Cardiac Enzymes  Recent Labs Lab 04/29/16 1054 04/29/16 1406 04/29/16 2025  TROPONINI <0.03 <0.03 <0.03    Glucose  Recent Labs Lab 05/04/16 1215 05/04/16 1641 05/04/16 1945 05/04/16 2344 05/05/16 0344 05/05/16 0832  GLUCAP 154* 284* 133* 92 86 101*    Imaging Dg Chest Port 1 View  Result Date: 05/05/2016 CLINICAL DATA:  Fever EXAM: PORTABLE CHEST 1 VIEW COMPARISON:  05/02/2016 FINDINGS: Bilateral lower lobe airspace opacity is are again noted, slightly improved. Interstitial prominence has improved as well. No definite effusions. Heart is borderline in size. IMPRESSION: Improving interstitial prominence and bibasilar opacities. Electronically Signed   By: Charlett Nose M.D.   On: 05/05/2016 08:53   CXR 1/31: continuing improved aeration w/ bilateral airspace disease.    STUDIES:  CXR 1/25 > diffuse airspace disease consistent with diffuse PNA vs ARDS  CXR 1/26 > slight interval improvement of diffuse airspace  disease Echo 1/26 > LVEF 55%, normal wall motion CT chest 1/28 > Diffuse bilateral interstitial opacity with geographic ground-glass opacities throughout the lungs favoring the lung bases, and some confluent dependent airspace opacities in the lower lobes. Secondary pulmonary lobular thickening.   CULTURES: Blood 1/25 > NGTD Sputum 1/25 > none collected RVP 1/25 > negative  Urine 1/25 > negative  ANTIBIOTICS: Azithromycin 1/25 > Rocephin 1/25 >   SIGNIFICANT EVENTS: 1/25  Presents to OSH with severe respiratory distress  1/26 1L IV fluids for mild hypotension overnight > tachypnea.  Then lasix + BiPAP with improvement.   LINES/TUBES: PIV  DISCUSSION:    Impression plan Acute hypoxic respiratory failure in setting of presumed viral pneumonitis +/- bacterial superinfection.  Complicated further by: volume overload and probable element of AECOPD -> ->completed 5d azith Autoimmune panel:  RF: 28.3 (elevated); CCP antibodies IgF/IgA: neg; CRP: 8.2; Mpo/pr-3 antibodies: negative; ANCA titers: >>>(pending); ANA: negative.   Plan Day 6/7 rocephin  Wean O2 Cont lasix as BUN/cr and BP allow (will hold this today) Aspiration precautions Slow pred taper (keep at 40 today) Will need f/u CT scan about 4-6 weeks after dc. If interstitial changes persist may need OLB to look for ILD Mobilize.    Simonne Martinet ACNP-BC [facility] Pulmonary/Critical Care Pager # [phone] OR # [phone] if no answer  Attending Note:  46 year old female with hypoxemic respiratory failure, now off BiPAP and down to 4L HFNC.  On exam, bibasilar crackles noted.  I reviewed chest CT myself, bilateral infiltrate noted.  Discussed with PCCM-NP and pharmacy.  Hypoxemic respiratory failure:  - Change to 4L [place]  - Titrate O2 for sat of 88-92%.  Pulmonary infiltrate:  - Repeat CT in 4-6 wks, if persistent then may need a biopsy  - Slow taper of steroids.  - F/U on auto-immune work up (only positive so far is  RF).  Pulmonary edema;  - Lasix as tolerated  - BMET in AM  CAP:  - Rocephin 6/7, stop date in place  - Monitor fever curve and WBC  Patient seen and examined, agree with above note.  I dictated the care and orders written for this patient under my direction.  Alyson Reedy, MD [phone]

## 2016-05-05 NOTE — Progress Notes (Signed)
Ambulated in hall, patient tolerated poorly. After approximately only 30 feet, patient became dizzy and weak and had to sit down and be wheeled back to room as patient didn't feel able to walk back to room.  Patient's oxygen saturation levels maintained at 88% or greater during ambulation.

## 2016-05-05 NOTE — Progress Notes (Signed)
PROGRESS NOTE    Katherine [name]  HDQ:[id number] DOB: Jul 13, 1970 DOA: 04/29/2016 PCP: No primary care provider on file.   No chief complaint on file.    Brief Narrative:  46 year old female with PMH of ETOH, tobacco use, hyperlipidemia and schizophrenia, presents to [facility] on 1/25 with history of 4 days of fever, chills, cough, and body aches, on arrival to ED required NRB, CXR revealed diffuse bilateral interstitial opacities, ultimately transferred to [facility] for admit for PCCM.   Transferred to stepdown. Pulmonology still following.  Weaning off of high-flow oxygen. Developed fever today, on antibiotics already.  Repeated CXR, blood cultures.   Assessment & Plan   Acute hypoxic respiratory failure secondary to possible pneumonia versus viral illness -Patient currently on azithromycin and ceftriaxone -PCCM following -Continue supplemental oxygen to maintain saturations between 88-90%- weaned off of high-flow, currently on nasal canula. Suspect patient will need O2 at discharge.  -Continue nebs, prednisone taper, BiPAP as needed -Autoimmune workup: ANA negative, ANCA WNL, CCP WNL, RF 28.3 elevated -ESR and CRP elevated -Respiratory viral panel negative -HIV and hep C negative -Per pulmonology, patient may have interstitial lung disease, she will need repeat follow-up CT scan in 4-6 weeks. If patient continues to have a persistent infiltrate, she will need surgical lung biopsy. -CXR today showed improvement  -Will continue IV lasix as BP permits  -Echocardiogram EF 55-60%, no RWMA, LV diastolic function normal  Fever -patient developed fever of 101.36F.   -CXR obtained showing improvement. -On antibiotics -Blood cultures and UA pending   History of hyperlipidemia -Continue statin  Thrombocytopenia -Improving, currently platelets 143 -Possibly secondary to infection  Tobacco abuse -Counseled regarding smoking cessation  Possible COPD -Patient will need follow-up  with pulmonology upon discharge  Schizophrenia/depression/anxiety -Continue Zoloft, Abilify -Continue hydroxyzine as needed  Alcohol abuse -Continue thiamine, folate, multivitamin  DVT Prophylaxis  heparin  Code Status: Full  Family Communication: None bedside  Disposition Plan: Admitted. Continue to monitor and stepdown given respiratory status. Developed fever, workup in progress.  Consultants PCCM  Procedures  Echocardiogram LVEF 55%, normal wall motion  Antibiotics   Anti-infectives    Start     Dose/Rate Route Frequency Ordered Stop   04/29/16 1000  azithromycin (ZITHROMAX) 500 mg in dextrose 5 % 250 mL IVPB  Status:  Discontinued     500 mg 250 mL/hr over 60 Minutes Intravenous Every 24 hours 04/29/16 0852 05/03/16 1221   04/29/16 0900  cefTRIAXone (ROCEPHIN) 1 g in dextrose 5 % 50 mL IVPB     1 g 100 mL/hr over 30 Minutes Intravenous Every 24 hours 04/29/16 0856 05/05/16 1194      Subjective:   Katherine [name] seen and examined today.  Patient feels her breathing is improving. Has mild cough. Denies any current chest pain, abdominal pain, nausea or vomiting, diarrhea constipation.    Objective:   Vitals:   05/05/16 1045 05/05/16 1051 05/05/16 1109 05/05/16 1200  BP:   (!) 86/52   Pulse:    96  Resp:   12   Temp:   99.2 F (37.3 C)   TempSrc:   Oral   SpO2: 90% 91% 96% 95%  Weight:      Height:        Intake/Output Summary (Last 24 hours) at 05/05/16 1213 Last data filed at 05/04/16 2328  Gross per 24 hour  Intake              360 ml  Output  350 ml  Net               10 ml   Filed Weights   05/03/16 0102 05/04/16 0500 05/05/16 0300  Weight: 71.2 kg (156 lb 15.5 oz) 69.4 kg (153 lb) 70.8 kg (156 lb)    Exam  General: Well developed, well nourished, NAD, appears stated age  6: NCAT,  mucous membranes moist. Currently on [place]  Cardiovascular: S1 S2 auscultated, no murmurs, RRR  Respiratory: Diminished breath sounds, faint  crackles  Abdomen: Soft, nontender, nondistended, + bowel sounds  Extremities: warm dry without cyanosis clubbing or edema  Neuro: AAOx3, nonfocal  Skin: Without rashes exudates or nodules, multiple tattoos   Psych: Normal affect and demeanor, pleasant   Data Reviewed: I have personally reviewed following labs and imaging studies  CBC:  Recent Labs Lab 05/01/16 0310 05/02/16 0223 05/03/16 0337 05/04/16 0209 05/05/16 0413  WBC 6.0 5.6 7.5 7.9 8.1  HGB 11.6* 10.8* 11.1* 11.2* 11.2*  HCT 32.9* 33.9* 34.7* 32.7* 34.7*  MCV 106.5* 98.8 98.9 105.1* 99.4  PLT 117* 114* 126* 139* 518*   Basic Metabolic Panel:  Recent Labs Lab 04/29/16 1054 04/30/16 0221 05/01/16 0310 05/02/16 0223 05/03/16 0203 05/04/16 0209 05/04/16 0828 05/05/16 0413  NA  --  140 144 141 139 137  --  134*  K  --  3.1* 3.6 3.6 3.3* 3.4*  --  4.1  CL  --  106 108 104 99* 99*  --  93*  CO2  --  23 30 29 31 31   --  32  GLUCOSE  --  136* 109* 101* 88 67  --  76  BUN  --  21* 22* 22* 21* 21*  --  23*  CREATININE  --  0.87 0.66 0.53 0.61 0.63  --  0.67  CALCIUM  --  8.2* 8.1* 7.9* 8.1* 8.1*  --  8.4*  MG 1.7 1.9 2.2 2.2 2.2  --  2.0  --   PHOS 3.3 2.5 2.2*  --  2.0*  --   --   --    GFR: Estimated Creatinine Clearance: 83.9 mL/min (by C-G formula based on SCr of 0.67 mg/dL). Liver Function Tests:  Recent Labs Lab 04/29/16 0809 04/30/16 0854 05/01/16 0310  AST 83* 78* 48*  ALT 136* 120* 77*  ALKPHOS 99 99 82  BILITOT 0.7 0.7 1.5*  PROT 6.0* 6.9 5.8*  ALBUMIN 2.6* 2.7* 2.3*   No results for input(s): LIPASE, AMYLASE in the last 168 hours. No results for input(s): AMMONIA in the last 168 hours. Coagulation Profile: No results for input(s): INR, PROTIME in the last 168 hours. Cardiac Enzymes:  Recent Labs Lab 04/29/16 1054 04/29/16 1406 04/29/16 2025  TROPONINI <0.03 <0.03 <0.03   BNP (last 3 results) No results for input(s): PROBNP in the last 8760 hours. HbA1C: No results for  input(s): HGBA1C in the last 72 hours. CBG:  Recent Labs Lab 05/04/16 1641 05/04/16 1945 05/04/16 2344 05/05/16 0344 05/05/16 0832  GLUCAP 284* 133* 92 86 101*   Lipid Profile: No results for input(s): CHOL, HDL, LDLCALC, TRIG, CHOLHDL, LDLDIRECT in the last 72 hours. Thyroid Function Tests: No results for input(s): TSH, T4TOTAL, FREET4, T3FREE, THYROIDAB in the last 72 hours. Anemia Panel: No results for input(s): VITAMINB12, FOLATE, FERRITIN, TIBC, IRON, RETICCTPCT in the last 72 hours. Urine analysis:    Component Value Date/Time   COLORURINE AMBER (A) 05/05/2016 1042   APPEARANCEUR CLEAR 05/05/2016 1042   LABSPEC 1.019  05/05/2016 1042   PHURINE 6.0 05/05/2016 1042   GLUCOSEU NEGATIVE 05/05/2016 1042   HGBUR NEGATIVE 05/05/2016 1042   BILIRUBINUR NEGATIVE 05/05/2016 1042   KETONESUR NEGATIVE 05/05/2016 1042   PROTEINUR NEGATIVE 05/05/2016 1042   NITRITE NEGATIVE 05/05/2016 1042   LEUKOCYTESUR NEGATIVE 05/05/2016 1042   Sepsis Labs: @LABRCNTIP (procalcitonin:4,lacticidven:4)  ) Recent Results (from the past 240 hour(s))  MRSA PCR Screening     Status: None   Collection Time: 04/29/16  6:38 AM  Result Value Ref Range Status   MRSA by PCR NEGATIVE NEGATIVE Final    Comment:        The GeneXpert MRSA Assay (FDA approved for NASAL specimens only), is one component of a comprehensive MRSA colonization surveillance program. It is not intended to diagnose MRSA infection nor to guide or monitor treatment for MRSA infections.   Culture, blood (routine x 2)     Status: None   Collection Time: 04/29/16  8:45 AM  Result Value Ref Range Status   Specimen Description BLOOD LEFT HAND  Final   Special Requests IN PEDIATRIC BOTTLE 3CC  Final   Culture NO GROWTH 5 DAYS  Final   Report Status 05/04/2016 FINAL  Final  Culture, blood (routine x 2)     Status: None   Collection Time: 04/29/16  8:48 AM  Result Value Ref Range Status   Specimen Description BLOOD RIGHT HAND   Final   Special Requests IN PEDIATRIC BOTTLE 2CC  Final   Culture NO GROWTH 5 DAYS  Final   Report Status 05/04/2016 FINAL  Final  Respiratory Panel by PCR     Status: None   Collection Time: 04/29/16 10:18 AM  Result Value Ref Range Status   Adenovirus NOT DETECTED NOT DETECTED Final   Coronavirus 229E NOT DETECTED NOT DETECTED Final   Coronavirus HKU1 NOT DETECTED NOT DETECTED Final   Coronavirus NL63 NOT DETECTED NOT DETECTED Final   Coronavirus OC43 NOT DETECTED NOT DETECTED Final   Metapneumovirus NOT DETECTED NOT DETECTED Final   Rhinovirus / Enterovirus NOT DETECTED NOT DETECTED Final   Influenza A NOT DETECTED NOT DETECTED Final   Influenza B NOT DETECTED NOT DETECTED Final   Parainfluenza Virus 1 NOT DETECTED NOT DETECTED Final   Parainfluenza Virus 2 NOT DETECTED NOT DETECTED Final   Parainfluenza Virus 3 NOT DETECTED NOT DETECTED Final   Parainfluenza Virus 4 NOT DETECTED NOT DETECTED Final   Respiratory Syncytial Virus NOT DETECTED NOT DETECTED Final   Bordetella pertussis NOT DETECTED NOT DETECTED Final   Chlamydophila pneumoniae NOT DETECTED NOT DETECTED Final   Mycoplasma pneumoniae NOT DETECTED NOT DETECTED Final  Culture, Urine     Status: None   Collection Time: 04/29/16  3:52 PM  Result Value Ref Range Status   Specimen Description URINE, RANDOM  Final   Special Requests NONE  Final   Culture NO GROWTH  Final   Report Status 04/30/2016 FINAL  Final      Radiology Studies: Dg Chest Port 1 View  Result Date: 05/05/2016 CLINICAL DATA:  Fever EXAM: PORTABLE CHEST 1 VIEW COMPARISON:  05/02/2016 FINDINGS: Bilateral lower lobe airspace opacity is are again noted, slightly improved. Interstitial prominence has improved as well. No definite effusions. Heart is borderline in size. IMPRESSION: Improving interstitial prominence and bibasilar opacities. Electronically Signed   By: Rolm Baptise M.D.   On: 05/05/2016 08:53     Scheduled Meds: . ARIPiprazole  5 mg Oral  Daily  . feeding supplement (ENSURE ENLIVE)  237 mL Oral BID BM  . folic acid  1 mg Oral Daily  . heparin  5,000 Units Subcutaneous Q8H  . ipratropium-albuterol  3 mL Nebulization Q6H  . mouth rinse  15 mL Mouth Rinse BID  . predniSONE  40 mg Oral Q breakfast  . sertraline  25 mg Oral Daily  . simvastatin  20 mg Oral q1800  . thiamine  100 mg Oral Daily   Continuous Infusions:   LOS: 6 days   Time Spent in minutes   30 minutes  Charlestine Rookstool D.O. on 05/05/2016 at 12:13 PM  Between 7am to 7pm - Pager - [phone]  After 7pm go to [email or web address] - password [email or web address]  And look for the night coverage person covering for me after hours  [facility] Office  [phone]

## 2016-05-06 ENCOUNTER — Encounter (HOSPITAL_COMMUNITY): Payer: Self-pay | Admitting: *Deleted

## 2016-05-06 DIAGNOSIS — J810 Acute pulmonary edema: Secondary | ICD-10-CM

## 2016-05-06 LAB — CBC
HCT: 32.4 % — ABNORMAL LOW (ref 36.0–46.0)
Hemoglobin: 11.3 g/dL — ABNORMAL LOW (ref 12.0–15.0)
MCH: 35.4 pg — ABNORMAL HIGH (ref 26.0–34.0)
MCHC: 34.9 g/dL (ref 30.0–36.0)
MCV: 101.6 fL — AB (ref 78.0–100.0)
PLATELETS: 128 10*3/uL — AB (ref 150–400)
RBC: 3.19 MIL/uL — AB (ref 3.87–5.11)
RDW: 16.8 % — AB (ref 11.5–15.5)
WBC: 6.7 10*3/uL (ref 4.0–10.5)

## 2016-05-06 LAB — BASIC METABOLIC PANEL
Anion gap: 9 (ref 5–15)
BUN: 14 mg/dL (ref 6–20)
CALCIUM: 8.4 mg/dL — AB (ref 8.9–10.3)
CO2: 31 mmol/L (ref 22–32)
Chloride: 96 mmol/L — ABNORMAL LOW (ref 101–111)
Creatinine, Ser: 0.63 mg/dL (ref 0.44–1.00)
GFR calc Af Amer: >60 mL/min (ref >60)
GLUCOSE: 82 mg/dL (ref 65–99)
POTASSIUM: 3.8 mmol/L (ref 3.5–5.1)
SODIUM: 136 mmol/L (ref 135–145)

## 2016-05-06 MED ORDER — GUAIFENESIN ER 600 MG PO TB12
600.0000 mg | ORAL_TABLET | Freq: Two times a day (BID) | ORAL | Status: DC
  Administered 2016-05-06 – 2016-05-07 (×3): 600 mg via ORAL
  Filled 2016-05-06 (×3): qty 1

## 2016-05-06 MED ORDER — IPRATROPIUM-ALBUTEROL 0.5-2.5 (3) MG/3ML IN SOLN
3.0000 mL | Freq: Three times a day (TID) | RESPIRATORY_TRACT | Status: DC
Start: 2016-05-06 — End: 2016-05-07
  Administered 2016-05-06 – 2016-05-07 (×2): 3 mL via RESPIRATORY_TRACT
  Filled 2016-05-06 (×3): qty 3

## 2016-05-06 NOTE — Progress Notes (Signed)
[facility] Progress Note  Patient: Katherine [name]   PCP: No primary care provider on file. DOB: [birth year]   DOA: 04/29/2016   DOS: 05/06/2016   Date of Service: the patient was seen and examined on 05/06/2016   Subjective: feeling better, shortnes of breath is better but still present, cough is better as well  Brief hospital course: 46 year old female with PMH of ETOH, tobacco use, hyperlipidemia and schizophrenia, presents to [facility] on 1/25 with history of 4 days of fever, chills, cough, and body aches, on arrival to ED required NRB, CXR revealed diffuse bilateral interstitial opacities, ultimately transferred to [facility] for admit for PCCM.   Transferred to stepdown. Pulmonology still following.  Weaning off of high-flow oxygen. Developed fever today, on antibiotics already.  Repeated CXR, blood cultures.  Currently further plan is wean oxygen.  Assessment and Plan: Acute hypoxic respiratory failure secondary to possible pneumonia versus viral illness -Patient currently on azithromycin and ceftriaxone, completed 7 days treatment. -PCCM consulted, now signed off -Continue supplemental oxygen to maintain saturations between 88-90%- weaned off of high-flow, currently on nasal canula. Suspect patient will need O2 at discharge.  -Continue nebs, prednisone taper, BiPAP as needed -Autoimmune workup: ANA negative, ANCA WNL, CCP WNL, RF 28.3 elevated -ESR and CRP elevated -Respiratory viral panel negative -HIV and hep C negative -Per pulmonology, patient may have interstitial lung disease, she will need repeat follow-up CT scan in 4-6 weeks. If patient continues to have a persistent infiltrate, she will need surgical lung biopsy. -Will continue lasix as BP permits  -Echocardiogram EF 55-60%, no RWMA, LV diastolic function normal  Fever -patient developed fever of 101.73F.   -CXR obtained showing improvement. -On antibiotics -Blood cultures and UA  negative  History of hyperlipidemia -Continue statin  Thrombocytopenia -Improving, currently platelets 143 -Possibly secondary to infection  Tobacco abuse -Counseled regarding smoking cessation  Possible COPD -Patient will need follow-up with pulmonology upon discharge  Schizophrenia/depression/anxiety -Continue Zoloft, Abilify -Continue hydroxyzine as needed  Alcohol abuse -Continue thiamine, folate, multivitamin  Bowel regimen: last BM 05/03/2016 Diet: cardiac diet DVT Prophylaxis: subcutaneous Heparin  Advance goals of care discussion: full code  Family Communication: family was present at bedside, at the time of interview. The pt provided permission to discuss medical plan with the family. Opportunity was given to ask question and all questions were answered satisfactorily.   Disposition:  Discharge to home. Expected discharge date: 05/07/2016, stabilization of oxygen and improvement.   Consultants: CCM Procedures: bipap, echocardiogram  Antibiotics: Anti-infectives    Start     Dose/Rate Route Frequency Ordered Stop   04/29/16 1000  azithromycin (ZITHROMAX) 500 mg in dextrose 5 % 250 mL IVPB  Status:  Discontinued     500 mg 250 mL/hr over 60 Minutes Intravenous Every 24 hours 04/29/16 0852 05/03/16 1221   04/29/16 0900  cefTRIAXone (ROCEPHIN) 1 g in dextrose 5 % 50 mL IVPB     1 g 100 mL/hr over 30 Minutes Intravenous Every 24 hours 04/29/16 0856 05/05/16 0954      Objective: Physical Exam: Vitals:   05/06/16 0703 05/06/16 0904 05/06/16 1242 05/06/16 1401  BP:  (!) 100/57 (!) 89/53 (!) 94/56  Pulse: 77 78 72 66  Resp: 14   18  Temp:  98.1 F (36.7 C) 98.6 F (37 C) 99.1 F (37.3 C)  TempSrc:  Oral Oral Oral  SpO2: 96% 97% 92% 97%  Weight:    68.6 kg (151 lb 4.8 oz)  Height:    5' 3"  (1.6 m)    Intake/Output Summary (Last 24 hours) at 05/06/16 1512 Last data filed at 05/06/16 1430  Gross per 24 hour  Intake              240 ml  Output               300 ml  Net              -60 ml   Filed Weights   05/05/16 0300 05/06/16 0600 05/06/16 1401  Weight: 70.8 kg (156 lb) 69.7 kg (153 lb 11.2 oz) 68.6 kg (151 lb 4.8 oz)    General: Alert, Awake and Oriented to Time, Place and Person. Appear in mild distress, affect appropriate Eyes: PERRL, Conjunctiva normal ENT: Oral Mucosa clear moist. Neck: no JVD, no Abnormal Mass Or lumps Cardiovascular: S1 and S2 Present, no Murmur, Respiratory: Bilateral Air entry equal and Decreased, no use of accessory muscle, bilateral Crackles, Occasional wheezes Abdomen: Bowel Sound present, Soft and no tenderness Skin: no redness, no Rash, no induration Extremities: no Pedal edema, no calf tenderness Neurologic: Grossly no focal neuro deficit. Bilaterally Equal motor strength  Data Reviewed: CBC:  Recent Labs Lab 05/02/16 0223 05/03/16 0337 05/04/16 0209 05/05/16 0413 05/06/16 0303  WBC 5.6 7.5 7.9 8.1 6.7  HGB 10.8* 11.1* 11.2* 11.2* 11.3*  HCT 33.9* 34.7* 32.7* 34.7* 32.4*  MCV 98.8 98.9 105.1* 99.4 101.6*  PLT 114* 126* 139* 143* 315*   Basic Metabolic Panel:  Recent Labs Lab 04/30/16 0221 05/01/16 0310 05/02/16 0223 05/03/16 0203 05/04/16 0209 05/04/16 0828 05/05/16 0413 05/06/16 0303  NA 140 144 141 139 137  --  134* 136  K 3.1* 3.6 3.6 3.3* 3.4*  --  4.1 3.8  CL 106 108 104 99* 99*  --  93* 96*  CO2 23 30 29 31 31   --  32 31  GLUCOSE 136* 109* 101* 88 67  --  76 82  BUN 21* 22* 22* 21* 21*  --  23* 14  CREATININE 0.87 0.66 0.53 0.61 0.63  --  0.67 0.63  CALCIUM 8.2* 8.1* 7.9* 8.1* 8.1*  --  8.4* 8.4*  MG 1.9 2.2 2.2 2.2  --  2.0  --   --   PHOS 2.5 2.2*  --  2.0*  --   --   --   --     Liver Function Tests:  Recent Labs Lab 04/30/16 0854 05/01/16 0310  AST 78* 48*  ALT 120* 77*  ALKPHOS 99 82  BILITOT 0.7 1.5*  PROT 6.9 5.8*  ALBUMIN 2.7* 2.3*   No results for input(s): LIPASE, AMYLASE in the last 168 hours. No results for input(s): AMMONIA in the  last 168 hours. Coagulation Profile: No results for input(s): INR, PROTIME in the last 168 hours. Cardiac Enzymes:  Recent Labs Lab 04/29/16 2025  TROPONINI <0.03   BNP (last 3 results) No results for input(s): PROBNP in the last 8760 hours.  CBG:  Recent Labs Lab 05/04/16 1641 05/04/16 1945 05/04/16 2344 05/05/16 0344 05/05/16 0832  GLUCAP 284* 133* 92 86 101*    Studies: No results found.   Scheduled Meds: . ARIPiprazole  5 mg Oral Daily  . feeding supplement (ENSURE ENLIVE)  237 mL Oral BID BM  . folic acid  1 mg Oral Daily  . guaiFENesin  600 mg Oral BID  . heparin  5,000 Units Subcutaneous Q8H  . ipratropium-albuterol  3 mL Nebulization  TID  . mouth rinse  15 mL Mouth Rinse BID  . predniSONE  40 mg Oral Q breakfast  . sertraline  25 mg Oral Daily  . simvastatin  20 mg Oral q1800  . thiamine  100 mg Oral Daily   Continuous Infusions: PRN Meds: sodium chloride, acetaminophen, albuterol, dextromethorphan, hydrOXYzine, traZODone  Time spent: 30 minutes  Author: Berle Mull, MD [facility] Pager: [phone] 05/06/2016 3:12 PM  If 7PM-7AM, please contact night-coverage at [email or web address], password [facility]

## 2016-05-06 NOTE — Progress Notes (Signed)
RT assessment was done early this AM, and treatments were never changed. Score 7, which shows that her treatments should be TID and PRN. Will change accordingly.

## 2016-05-06 NOTE — Progress Notes (Signed)
PULMONARY / CRITICAL CARE MEDICINE   Name: Katherine [name] MRN: [id number] DOB: 04-13-1970    ADMISSION DATE:  04/29/2016 CONSULTATION DATE:  04/29/2016  REFERRING MD:  Dr. Carver Fila, [facility]   CHIEF COMPLAINT:  Dyspnea   BRIEF SUMMARY:  46 year old female with PMH of ETOH, tobacco use, hyperlipidemia and schizophrenia, presents to [facility] on 1/25 with history of 4 days of fever, chills, cough, and body aches, on arrival to ED required NRB, CXR revealed diffuse bilateral interstitial opacities, ultimately transferred to [facility] for admit for PCCM.   SUBJECTIVE:  Feeling better  VITAL SIGNS: BP (!) 91/55 (BP Location: Right Arm)   Pulse 77   Temp 99.7 F (37.6 C) (Oral)   Resp 14   Ht 5' 3.5" (1.613 m)   Wt 153 lb 11.2 oz (69.7 kg)   SpO2 96%   BMI 26.80 kg/m  Oxygen: 4 liters    INTAKE / OUTPUT:  Intake/Output Summary (Last 24 hours) at 05/06/16 0857 Last data filed at 05/05/16 1800  Gross per 24 hour  Intake              600 ml  Output              400 ml  Net              200 ml    General appearance:  46 Year old  female, well nourished, NAD, conversant  Eyes: anicteric sclerae, moist conjunctivae; PERRL, EOMI bilaterally. Mouth:  Moist membranes and no mucosal ulcerations; normal hard and soft palate Neck: Trachea midline; neck supple, no JVD Lungs/chest: crackles bilaterally best heard posterior back, with normal respiratory effort and no intercostal retractions CV: RRR, no MRGs  Abdomen: Soft, non-tender; no masses or HSM Extremities: No peripheral edema or extremity lymphadenopathy Skin: Normal temperature, turgor and texture; no rash, ulcers or subcutaneous nodules, multiple tattoos  Psych: Appropriate affect, alert and oriented to person, place and time  LABS:  BMET  Recent Labs Lab 05/04/16 0209 05/05/16 0413 05/06/16 0303  NA 137 134* 136  K 3.4* 4.1 3.8  CL 99* 93* 96*  CO2 31 32 31  BUN 21* 23* 14   CREATININE 0.63 0.67 0.63  GLUCOSE 67 76 82    Electrolytes  Recent Labs Lab 04/30/16 0221 05/01/16 0310 05/02/16 0223 05/03/16 0203 05/04/16 0209 05/04/16 0828 05/05/16 0413 05/06/16 0303  CALCIUM 8.2* 8.1* 7.9* 8.1* 8.1*  --  8.4* 8.4*  MG 1.9 2.2 2.2 2.2  --  2.0  --   --   PHOS 2.5 2.2*  --  2.0*  --   --   --   --     CBC  Recent Labs Lab 05/04/16 0209 05/05/16 0413 05/06/16 0303  WBC 7.9 8.1 6.7  HGB 11.2* 11.2* 11.3*  HCT 32.7* 34.7* 32.4*  PLT 139* 143* 128*    Coag's No results for input(s): APTT, INR in the last 168 hours.  Sepsis Markers  Recent Labs Lab 04/29/16 1054 04/30/16 0221 05/01/16 0310  LATICACIDVEN 1.3  --   --   PROCALCITON  --  4.67 2.34    ABG  Recent Labs Lab 04/30/16 0231 04/30/16 0400  PHART 7.359 7.365  PCO2ART 46.8 49.2*  PO2ART 68.4* 77.3*    Liver Enzymes  Recent Labs Lab 04/30/16 0854 05/01/16 0310  AST 78* 48*  ALT 120* 77*  ALKPHOS 99 82  BILITOT 0.7 1.5*  ALBUMIN 2.7* 2.3*  Cardiac Enzymes  Recent Labs Lab 04/29/16 1054 04/29/16 1406 04/29/16 2025  TROPONINI <0.03 <0.03 <0.03    Glucose  Recent Labs Lab 05/04/16 1215 05/04/16 1641 05/04/16 1945 05/04/16 2344 05/05/16 0344 05/05/16 0832  GLUCAP 154* 284* 133* 92 86 101*    Imaging No results found. CXR 1/31: continuing improved aeration w/ bilateral airspace disease.    STUDIES:  CXR 1/25 > diffuse airspace disease consistent with diffuse PNA vs ARDS  CXR 1/26 > slight interval improvement of diffuse airspace disease Echo 1/26 > LVEF 55%, normal wall motion CT chest 1/28 > Diffuse bilateral interstitial opacity with geographic ground-glass opacities throughout the lungs favoring the lung bases, and some confluent dependent airspace opacities in the lower lobes. Secondary pulmonary lobular thickening.   CULTURES: Blood 1/25 > NGTD Sputum 1/25 > none collected RVP 1/25 > negative  Urine 1/25 >  negative  ANTIBIOTICS: Azithromycin 1/25 >1/31 Rocephin 1/25 >   SIGNIFICANT EVENTS: 1/25  Presents to OSH with severe respiratory distress  1/26 1L IV fluids for mild hypotension overnight > tachypnea.  Then lasix + BiPAP with improvement.   LINES/TUBES: PIV  DISCUSSION:  Acute hypoxic respiratory failure in setting of presumed viral pneumonitis +/- bacterial superinfection (NOS).  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   Autoimmune panel:  RF: 28.3 (elevated); CCP antibodies IgF/IgA: neg; CRP: 8.2; Mpo/pr-3 antibodies: negative; ANCA titers: >>>(pending); ANA: negative.   Plan Complete rocephin today (2/1) Wean O2 Slow pred taper over 2 weeks F/u CT scan 4-6 weeks if CXR does not return to normal; will make f/u in our clinic and place in computer    Simonne MartinetPeter E Babcock ACNP-BC [facility] Pulmonary/Critical Care Pager # [phone] OR # [phone] if no answer  Attending Note:  46 year old female with hypoxemic respiratory failure, now off BiPAP and down to 4L [place].  On exam, breathing more comfortably and lungs are clearing somewhat.  I reviewed chest X-ray myself, bilateral infiltrate persistent.  Discussed with PCCM-NP and pharmacy.  Hypoxemic respiratory failure:             - Change to 4L [place]             - Titrate O2 for sat of 88-92%.  - Will need an ambulatory desat study prior to discharge for qualification and level of home O2.  Pulmonary infiltrate:             - Repeat CT in 4-6 wks, if persistent will need a biopsy             - Slow taper of steroids over the next 2 wks, currently on 40 mg of prednisone  Pulmonary edema;             - Lasix as tolerated             - BMET in AM  - Replace electrolytes as indicated  CAP:             - Rocephin 7/7, d/c after today's dose.             - Monitor fever curve and WBC.  PCCM will sign off, please call back if needed.  F/U as above.  Patient seen and examined,  agree with above note.  I dictated the care and orders written for this patient under my direction.  Alyson ReedyWesam G Morrill Bomkamp, MD [phone]\

## 2016-05-07 DIAGNOSIS — J441 Chronic obstructive pulmonary disease with (acute) exacerbation: Secondary | ICD-10-CM | POA: Diagnosis present

## 2016-05-07 LAB — CBC WITH DIFFERENTIAL/PLATELET
BLASTS: 0 %
Band Neutrophils: 0 %
Basophils Absolute: 0 10*3/uL (ref 0.0–0.1)
Basophils Relative: 0 %
EOS ABS: 0 10*3/uL (ref 0.0–0.7)
Eosinophils Relative: 0 %
HCT: 34.3 % — ABNORMAL LOW (ref 36.0–46.0)
HEMOGLOBIN: 11.3 g/dL — AB (ref 12.0–15.0)
LYMPHS PCT: 30 %
Lymphs Abs: 2.7 10*3/uL (ref 0.7–4.0)
MCH: 31.9 pg (ref 26.0–34.0)
MCHC: 32.9 g/dL (ref 30.0–36.0)
MCV: 96.9 fL (ref 78.0–100.0)
Metamyelocytes Relative: 0 %
Monocytes Absolute: 0.2 10*3/uL (ref 0.1–1.0)
Monocytes Relative: 2 %
Myelocytes: 0 %
NEUTROS PCT: 68 %
NRBC: 0 /100{WBCs}
Neutro Abs: 6.1 10*3/uL (ref 1.7–7.7)
OTHER: 0 %
PROMYELOCYTES ABS: 0 %
Platelets: 150 10*3/uL (ref 150–400)
RBC: 3.54 MIL/uL — ABNORMAL LOW (ref 3.87–5.11)
RDW: 17 % — ABNORMAL HIGH (ref 11.5–15.5)
WBC: 9 10*3/uL (ref 4.0–10.5)

## 2016-05-07 LAB — COMPREHENSIVE METABOLIC PANEL
ALK PHOS: 63 U/L (ref 38–126)
ALT: 27 U/L (ref 14–54)
AST: 50 U/L — ABNORMAL HIGH (ref 15–41)
Albumin: 2.8 g/dL — ABNORMAL LOW (ref 3.5–5.0)
Anion gap: 11 (ref 5–15)
BUN: 8 mg/dL (ref 6–20)
CALCIUM: 8.8 mg/dL — AB (ref 8.9–10.3)
CO2: 28 mmol/L (ref 22–32)
CREATININE: 0.84 mg/dL (ref 0.44–1.00)
Chloride: 96 mmol/L — ABNORMAL LOW (ref 101–111)
GFR calc non Af Amer: >60 mL/min (ref >60)
Glucose, Bld: 110 mg/dL — ABNORMAL HIGH (ref 65–99)
Potassium: 3 mmol/L — ABNORMAL LOW (ref 3.5–5.1)
SODIUM: 135 mmol/L (ref 135–145)
Total Bilirubin: 1.1 mg/dL (ref 0.3–1.2)
Total Protein: 6.5 g/dL (ref 6.5–8.1)

## 2016-05-07 MED ORDER — GUAIFENESIN ER 600 MG PO TB12: 600.0000 mg | ORAL_TABLET | Freq: Two times a day (BID) | ORAL | 0 refills | Status: DC

## 2016-05-07 MED ORDER — FOLIC ACID 1 MG PO TABS: 1.0000 mg | ORAL_TABLET | Freq: Every day | ORAL | 0 refills | Status: DC

## 2016-05-07 MED ORDER — FUROSEMIDE 20 MG PO TABS: 20.0000 mg | ORAL_TABLET | Freq: Every day | ORAL | 0 refills | Status: DC | PRN

## 2016-05-07 MED ORDER — TIOTROPIUM BROMIDE MONOHYDRATE 18 MCG IN CAPS: 18.0000 ug | ORAL_CAPSULE | Freq: Every day | RESPIRATORY_TRACT | 0 refills | Status: DC

## 2016-05-07 MED ORDER — THIAMINE HCL 100 MG PO TABS
100.0000 mg | ORAL_TABLET | Freq: Every day | ORAL | 0 refills | Status: DC
Start: 2016-05-08 — End: 2016-07-08

## 2016-05-07 MED ORDER — ALBUTEROL SULFATE HFA 108 (90 BASE) MCG/ACT IN AERS: 2.0000 | INHALATION_SPRAY | Freq: Four times a day (QID) | RESPIRATORY_TRACT | 2 refills | Status: DC | PRN

## 2016-05-07 MED ORDER — POTASSIUM CHLORIDE CRYS ER 20 MEQ PO TBCR
40.0000 meq | EXTENDED_RELEASE_TABLET | Freq: Once | ORAL | Status: AC
  Administered 2016-05-07: 40 meq via ORAL
  Filled 2016-05-07: qty 2

## 2016-05-07 MED ORDER — PREDNISONE 10 MG PO TABS: ORAL_TABLET | ORAL | 1 refills | Status: DC

## 2016-05-07 MED ORDER — BUDESONIDE-FORMOTEROL FUMARATE 80-4.5 MCG/ACT IN AERO: 2.0000 | INHALATION_SPRAY | Freq: Two times a day (BID) | RESPIRATORY_TRACT | 0 refills | Status: DC

## 2016-05-07 MED ORDER — ENSURE ENLIVE PO LIQD: 237.0000 mL | Freq: Two times a day (BID) | ORAL | 12 refills | Status: DC

## 2016-05-07 NOTE — Discharge Summary (Signed)
[facility] Discharge Summary   Patient: Katherine [name] JKD:[id number]   PCP: No primary care provider on file. DOB: 08/22/1970   Date of admission: 04/29/2016   Date of discharge:  05/07/2016    Discharge Diagnoses:  Principal Problem:   Acute respiratory failure with hypoxia ([place]) Active Problems:   Tobacco abuse   Thrombocytopenia ([place])   Pneumonia   COPD exacerbation ([place])   Admitted From: home Disposition:  Home with Oxygen  Recommendations for Outpatient Follow-up:  1. Please follow up with PCP in 1 week 2. Please follow up with pulmonary in 1 month as Recommended  Follow-up Information    Marshell Garfinkel, MD Follow up on 06/14/2016.   Specialty:  Pulmonary Disease Why:  at 230 pm  Contact information: [address] [place] [address] [phone]        PCP. Schedule an appointment as soon as possible for a visit in 1 week(s).          Diet recommendation: cardiac diet  Activity: The patient is advised to gradually reintroduce usual activities.  Discharge Condition: good  Code Status: full code  History of present illness: As per the H and P dictated on admission, "46 year old female with PMH of ETOH, tobacco use, hyperlipidemia and schizophrenia, presents to [facility] on 1/25 with history of 4 days of fever, chills, cough, and body aches, on arrival to ED required NRB, CXR revealed diffuse bilateral interstitial opacities, ultimately transferred to [facility] for admit for PCCM. "  Hospital Course:   Summary of her active problems in the hospital is as following. Acute hypoxic respiratory failure secondary to possible pneumonia versus viral illness -Patient currently on azithromycin and ceftriaxone, completed 7 days treatment. -PCCM consulted, now signed off -Continue supplemental oxygen to maintain saturations between 88-90%- weaned off of high-flow, currently on nasal canula. Suspect patient will need O2 at discharge.  -Continue  nebs, prednisone taper, BiPAP as needed -Autoimmune workup: ANA negative, ANCA WNL, CCP WNL, RF 28.3 elevated -ESR and CRP elevated -Respiratory viral panel negative -HIV and hep C negative -Per pulmonology, patient may have interstitial lung disease, she will need repeat follow-up CT scan in 4-6 weeks. If patient continues to have a persistent infiltrate, she will need surgical lung biopsy. -Will continue lasix as PRN -Echocardiogram EF 55-60%, no RWMA, LV diastolic function normal  Fever -patient developed fever of 101.32F.  -CXR obtained showing improvement. -completed antibiotics -Blood cultures and UA negative  History of hyperlipidemia -Continuestatin  Thrombocytopenia -Improving, currently platelets 143 -Possibly secondary to infection  Tobacco abuse -Counseled regarding smoking cessation  Untreated COPD -Patient will need follow-up with pulmonology upon discharge - discharging on home O2, albuterol, Symbicort and Spiriva.   Schizophrenia/depression/anxiety -Continue Zoloft, Abilify -Continue hydroxyzine as needed  Alcohol abuse -Continue thiamine, folate, multivitamin  All other chronic medical condition were stable during the hospitalization.  Patient wasseen by physical therapy, who recommended no home therapy needed, Home oxygen was arranged by case manager. On the day of the discharge the patient's vitals were stable , and no other acute medical condition were reported by patient. the patient was felt safe to be discharge at home with family.  Procedures and Results:  bipap  Echocardiogram    Consultations:  CCM primary admission   DISCHARGE MEDICATION: Current Discharge Medication List    START taking these medications   Details  albuterol (PROVENTIL HFA;VENTOLIN HFA) 108 (90 Base) MCG/ACT inhaler Inhale 2 puffs into the lungs every 6 (  six) hours as needed for wheezing or shortness of breath. Qty: 1 Inhaler, Refills: 2      budesonide-formoterol (SYMBICORT) 80-4.5 MCG/ACT inhaler Inhale 2 puffs into the lungs 2 (two) times daily. Qty: 1 Inhaler, Refills: 0    feeding supplement, ENSURE ENLIVE, (ENSURE ENLIVE) LIQD Take 237 mLs by mouth 2 (two) times daily between meals. Qty: 237 mL, Refills: 12    folic acid (FOLVITE) 1 MG tablet Take 1 tablet (1 mg total) by mouth daily. Qty: 30 tablet, Refills: 0    furosemide (LASIX) 20 MG tablet Take 1 tablet (20 mg total) by mouth daily as needed for fluid or edema (Weight gain of more than 3 pounds in one day). Qty: 30 tablet, Refills: 0    guaiFENesin (MUCINEX) 600 MG 12 hr tablet Take 1 tablet (600 mg total) by mouth 2 (two) times daily. Qty: 30 tablet, Refills: 0    predniSONE (DELTASONE) 10 MG tablet Take 11m daily for 3days,Take 328mdaily for 3days,Take 2075maily for 3days,Take 31m44mily till seen by pulmonary. Qty: 60 tablet, Refills: 1    thiamine 100 MG tablet Take 1 tablet (100 mg total) by mouth daily. Qty: 30 tablet, Refills: 0    tiotropium (SPIRIVA HANDIHALER) 18 MCG inhalation capsule Place 1 capsule (18 mcg total) into inhaler and inhale daily. Qty: 30 capsule, Refills: 0      CONTINUE these medications which have NOT CHANGED   Details  acetaminophen (TYLENOL) 325 MG tablet Take 650 mg by mouth every 6 (six) hours as needed for moderate pain or headache.    ARIPiprazole (ABILIFY) 5 MG tablet Take 5 mg by mouth daily.    gabapentin (NEURONTIN) 300 MG capsule Take 300 mg by mouth 3 (three) times daily.    hydrOXYzine (ATARAX/VISTARIL) 50 MG tablet Take 50 mg by mouth 4 (four) times daily.    sertraline (ZOLOFT) 25 MG tablet Take 25 mg by mouth daily.    simvastatin (ZOCOR) 20 MG tablet Take 20 mg by mouth daily.    traZODone (DESYREL) 100 MG tablet Take 100 mg by mouth at bedtime.       Allergies  Allergen Reactions  . Doxycycline Swelling    Discharge Exam: Filed Weights   05/06/16 0600 05/06/16 1401 05/07/16 0615  Weight:  69.7 kg (153 lb 11.2 oz) 68.6 kg (151 lb 4.8 oz) 68.9 kg (151 lb 14.4 oz)   Vitals:   05/07/16 0615 05/07/16 0758  BP: (!) 94/49 (!) 95/45  Pulse: 100 (!) 104  Resp: 20 18  Temp: 99.4 F (37.4 C) 99.1 F (37.3 C)   General: Appear in no distress, no Rash; Oral Mucosa moist. Cardiovascular: S1 and S2 Present, no Murmur, no JVD Respiratory: Bilateral Air entry present and Clear to Auscultation, no Crackles, no wheezes Abdomen: Bowel Sound present, Soft and no tenderness Extremities: no Pedal edema, no calf tenderness Neurology: Grossly no focal neuro deficit.  The results of significant diagnostics from this hospitalization (including imaging, microbiology, ancillary and laboratory) are listed below for reference.    Significant Diagnostic Studies: Ct Chest Wo Contrast  Result Date: 05/02/2016 CLINICAL DATA:  Acute respiratory failure with hypoxia. EXAM: CT CHEST WITHOUT CONTRAST TECHNIQUE: Multidetector CT imaging of the chest was performed following the standard protocol without IV contrast. COMPARISON:  04/30/2016 FINDINGS: Cardiovascular: Coronary, aortic arch, and branch vessel atherosclerotic vascular disease. Mild cardiomegaly. Mediastinum/Nodes: Prevascular node 0.9 cm in short axis image 41/3. Right paratracheal node 0.8 cm in short axis image 51/3. Right  hilar node 1.0 cm in short axis image 64/3. There appears to likely be left and right infrahilar lymph nodes of similar size. Subcarinal node 1.7 cm on image 73/3. Lungs/Pleura: Bilateral secondary pulmonary lobular septal accentuation with geographic ground-glass opacities especially in dependent portions of the lungs. More confluent but somewhat nodular airspace opacities in the lower lobes peripherally. Overall appearance bilaterally symmetric. Upper Abdomen: Unremarkable Musculoskeletal: Unremarkable IMPRESSION: 1. Diffuse bilateral interstitial opacity with geographic ground-glass opacities throughout the lungs favoring the lung  bases, and some confluent dependent airspace opacities in the lower lobes. Secondary pulmonary lobular thickening. Recent echocardiography showed normal left ventricular function although overall there appears to be mild cardiomegaly on today' s exam. Possibilities with this pattern include nonspecific interstitial pneumonia, organizing pneumonia (bronchiolitis obliterans with organizing pneumonia), infection by certain agents such as viral or mycoplasma, pulmonary hemorrhage, drug reaction, alveolar proteinosis, or edema. Electronically Signed   By: Van Clines M.D.   On: 05/02/2016 10:01   Dg Chest Port 1 View  Result Date: 05/05/2016 CLINICAL DATA:  Fever EXAM: PORTABLE CHEST 1 VIEW COMPARISON:  05/02/2016 FINDINGS: Bilateral lower lobe airspace opacity is are again noted, slightly improved. Interstitial prominence has improved as well. No definite effusions. Heart is borderline in size. IMPRESSION: Improving interstitial prominence and bibasilar opacities. Electronically Signed   By: Rolm Baptise M.D.   On: 05/05/2016 08:53   Dg Chest Port 1 View  Result Date: 05/02/2016 CLINICAL DATA:  Acute respiratory failure with hypoxia EXAM: PORTABLE CHEST 1 VIEW COMPARISON:  04/30/2016 FINDINGS: Prominent interstitial lung markings bilaterally show mild interval improvement. Bibasilar consolidation has progressed and likely is atelectasis. Small left effusion. IMPRESSION: Diffuse bilateral airspace disease with prominent interstitial component shows mild interval improvement. Possible edema or pneumonia. Progression of bibasilar consolidation left greater than right likely atelectasis. Small left effusion. Electronically Signed   By: Franchot Gallo M.D.   On: 05/02/2016 07:31   Dg Chest Port 1 View  Result Date: 04/30/2016 CLINICAL DATA:  Initial evaluation for increase respiratory distress. EXAM: PORTABLE CHEST 1 VIEW COMPARISON:  Prior radiograph from 04/29/2016. FINDINGS: Mild cardiomegaly, stable.  Mediastinal silhouette within normal limits. Lungs mildly hypoinflated. Diffuse airspace disease involving the bilateral lungs again seen, overall slightly improved. This may reflect improved edema or infection. No pleural effusion. No consolidative airspace disease. No pneumothorax. Osseous structures unchanged. IMPRESSION: 1. Slight interval improvement in diffuse airspace and disease, which may reflect improved infection and/ or edema. 2. No other new acute cardiopulmonary abnormality identified. Electronically Signed   By: Jeannine Boga M.D.   On: 04/30/2016 03:52   Dg Chest Port 1 View  Result Date: 04/29/2016 CLINICAL DATA:  Acute respiratory failure EXAM: PORTABLE CHEST 1 VIEW COMPARISON:  None. FINDINGS: There is diffuse airspace disease throughout the lungs. These findings are nonspecific but could indicate diffuse pneumonia, acute respiratory distress syndrome, or less likely edema. No effusion is seen. The heart is borderline enlarged. IMPRESSION: Diffuse airspace disease consistent with diffuse pneumonia or ARDS. Electronically Signed   By: Ivar Drape M.D.   On: 04/29/2016 08:39    Microbiology: Recent Results (from the past 240 hour(s))  MRSA PCR Screening     Status: None   Collection Time: 04/29/16  6:38 AM  Result Value Ref Range Status   MRSA by PCR NEGATIVE NEGATIVE Final    Comment:        The GeneXpert MRSA Assay (FDA approved for NASAL specimens only), is one component of a comprehensive MRSA colonization surveillance  program. It is not intended to diagnose MRSA infection nor to guide or monitor treatment for MRSA infections.   Culture, blood (routine x 2)     Status: None   Collection Time: 04/29/16  8:45 AM  Result Value Ref Range Status   Specimen Description BLOOD LEFT HAND  Final   Special Requests IN PEDIATRIC BOTTLE 3CC  Final   Culture NO GROWTH 5 DAYS  Final   Report Status 05/04/2016 FINAL  Final  Culture, blood (routine x 2)     Status: None    Collection Time: 04/29/16  8:48 AM  Result Value Ref Range Status   Specimen Description BLOOD RIGHT HAND  Final   Special Requests IN PEDIATRIC BOTTLE 2CC  Final   Culture NO GROWTH 5 DAYS  Final   Report Status 05/04/2016 FINAL  Final  Respiratory Panel by PCR     Status: None   Collection Time: 04/29/16 10:18 AM  Result Value Ref Range Status   Adenovirus NOT DETECTED NOT DETECTED Final   Coronavirus 229E NOT DETECTED NOT DETECTED Final   Coronavirus HKU1 NOT DETECTED NOT DETECTED Final   Coronavirus NL63 NOT DETECTED NOT DETECTED Final   Coronavirus OC43 NOT DETECTED NOT DETECTED Final   Metapneumovirus NOT DETECTED NOT DETECTED Final   Rhinovirus / Enterovirus NOT DETECTED NOT DETECTED Final   Influenza A NOT DETECTED NOT DETECTED Final   Influenza B NOT DETECTED NOT DETECTED Final   Parainfluenza Virus 1 NOT DETECTED NOT DETECTED Final   Parainfluenza Virus 2 NOT DETECTED NOT DETECTED Final   Parainfluenza Virus 3 NOT DETECTED NOT DETECTED Final   Parainfluenza Virus 4 NOT DETECTED NOT DETECTED Final   Respiratory Syncytial Virus NOT DETECTED NOT DETECTED Final   Bordetella pertussis NOT DETECTED NOT DETECTED Final   Chlamydophila pneumoniae NOT DETECTED NOT DETECTED Final   Mycoplasma pneumoniae NOT DETECTED NOT DETECTED Final  Culture, Urine     Status: None   Collection Time: 04/29/16  3:52 PM  Result Value Ref Range Status   Specimen Description URINE, RANDOM  Final   Special Requests NONE  Final   Culture NO GROWTH  Final   Report Status 04/30/2016 FINAL  Final  Culture, blood (routine x 2)     Status: None (Preliminary result)   Collection Time: 05/05/16  9:58 AM  Result Value Ref Range Status   Specimen Description BLOOD RIGHT ANTECUBITAL  Final   Special Requests IN PEDIATRIC BOTTLE 1CC  Final   Culture NO GROWTH 2 DAYS  Final   Report Status PENDING  Incomplete  Culture, blood (routine x 2)     Status: None (Preliminary result)   Collection Time: 05/05/16  9:58  AM  Result Value Ref Range Status   Specimen Description BLOOD BLOOD LEFT HAND  Final   Special Requests IN PEDIATRIC BOTTLE 1CC  Final   Culture NO GROWTH 2 DAYS  Final   Report Status PENDING  Incomplete     Labs: CBC:  Recent Labs Lab 05/03/16 0337 05/04/16 0209 05/05/16 0413 05/06/16 0303 05/07/16 0936  WBC 7.5 7.9 8.1 6.7 9.0  NEUTROABS  --   --   --   --  6.1  HGB 11.1* 11.2* 11.2* 11.3* 11.3*  HCT 34.7* 32.7* 34.7* 32.4* 34.3*  MCV 98.9 105.1* 99.4 101.6* 96.9  PLT 126* 139* 143* 128* 614   Basic Metabolic Panel:  Recent Labs Lab 05/01/16 0310 05/02/16 0223 05/03/16 0203 05/04/16 4315 05/04/16 4008 05/05/16 0413 05/06/16 0303 05/07/16  0936  NA 144 141 139 137  --  134* 136 135  K 3.6 3.6 3.3* 3.4*  --  4.1 3.8 3.0*  CL 108 104 99* 99*  --  93* 96* 96*  CO2 30 29 31 31   --  32 31 28  GLUCOSE 109* 101* 88 67  --  76 82 110*  BUN 22* 22* 21* 21*  --  23* 14 8  CREATININE 0.66 0.53 0.61 0.63  --  0.67 0.63 0.84  CALCIUM 8.1* 7.9* 8.1* 8.1*  --  8.4* 8.4* 8.8*  MG 2.2 2.2 2.2  --  2.0  --   --   --   PHOS 2.2*  --  2.0*  --   --   --   --   --    Liver Function Tests:  Recent Labs Lab 05/01/16 0310 05/07/16 0936  AST 48* 50*  ALT 77* 27  ALKPHOS 82 63  BILITOT 1.5* 1.1  PROT 5.8* 6.5  ALBUMIN 2.3* 2.8*   CBG:  Recent Labs Lab 05/04/16 1641 05/04/16 1945 05/04/16 2344 05/05/16 0344 05/05/16 0832  GLUCAP 284* 133* 92 86 101*   Time spent: 30 minutes  Signed:  Jamas Jaquay  [facility]  05/07/2016  , 5:41 PM

## 2016-05-07 NOTE — Evaluation (Signed)
Physical Therapy Evaluation Patient Details Name: Katherine LusterDeborah A Shroff MRN: [id number] DOB: Nov 29, 1970 Today's Date: 05/07/2016   History of Present Illness  Pt is a 46 y/o female admitted secondary to worsening dyspnea. PMH including but not limited to ETOH abuse, HLD and schizophrenia.   Clinical Impression  Pt presented supine in bed with HOB elevated, awake and willing to participate in therapy session. Prior to admission pt reported that she was independent with all functional mobility and ADLs. Pt currently at mod I to supervision level for all mobility. Pt ambulated on RA with SPO2 decreasing to as low as 67% with rebound to baseline with reapplication of O2. No further acute PT needs identified at this time. PT signing off.     Follow Up Recommendations No PT follow up    Equipment Recommendations  None recommended by PT    Recommendations for Other Services       Precautions / Restrictions Restrictions Weight Bearing Restrictions: No      Mobility  Bed Mobility Overal bed mobility: Modified Independent                Transfers Overall transfer level: Modified independent                  Ambulation/Gait Ambulation/Gait assistance: Supervision Ambulation Distance (Feet): 200 Feet Assistive device: 1 person hand held assist Gait Pattern/deviations: Step-through pattern;Decreased stride length Gait velocity: decreased Gait velocity interpretation: Below normal speed for age/gender General Gait Details: no instability or LOB, supervision for safety  Stairs            Wheelchair Mobility    Modified Rankin (Stroke Patients Only)       Balance Overall balance assessment: Needs assistance Sitting-balance support: Feet supported Sitting balance-Leahy Scale: Good     Standing balance support: During functional activity;No upper extremity supported Standing balance-Leahy Scale: Fair                               Pertinent  Vitals/Pain Pain Assessment: No/denies pain    Home Living Family/patient expects to be discharged to:: Private residence Living Arrangements: Spouse/significant other Available Help at Discharge: Friend(s);Available 24 hours/day   Home Access: Level entry     Home Layout: One level Home Equipment: Shower seat      Prior Function Level of Independence: Independent               Hand Dominance        Extremity/Trunk Assessment   Upper Extremity Assessment Upper Extremity Assessment: Overall WFL for tasks assessed    Lower Extremity Assessment Lower Extremity Assessment: Overall WFL for tasks assessed    Cervical / Trunk Assessment Cervical / Trunk Assessment: Normal  Communication   Communication: No difficulties  Cognition Arousal/Alertness: Awake/alert Behavior During Therapy: WFL for tasks assessed/performed;Flat affect Overall Cognitive Status: Within Functional Limits for tasks assessed                      General Comments      Exercises     Assessment/Plan    PT Assessment Patent does not need any further PT services  PT Problem List            PT Treatment Interventions      PT Goals (Current goals can be found in the Care Plan section)  Acute Rehab PT Goals Patient Stated Goal: return home    Frequency  Barriers to discharge        Co-evaluation               End of Session   Activity Tolerance: Patient tolerated treatment well Patient left: in bed;with call bell/phone within reach;with family/visitor present Nurse Communication: Mobility status         Time: 1610-9604 PT Time Calculation (min) (ACUTE ONLY): 10 min   Charges:   PT Evaluation $PT Eval Low Complexity: 1 Procedure     PT G CodesAlessandra Bevels Lyric Rossano 05/07/2016, 4:31 PM Gene Chalk, PT, DPT [phone]

## 2016-05-07 NOTE — Progress Notes (Signed)
OT Cancellation Note  Patient Details Name: Odis LusterDeborah A Deschepper MRN: [id number] DOB: 08/12/1970   Cancelled Treatment:    Reason Eval/Treat Not Completed: Other (comment). Noted that OT order has been cancelled. If OT eval needed please reorder.   Raynald KempKathryn Hermine Feria OTR/L Pager: [phone]   05/07/2016, 12:19 PM

## 2016-05-07 NOTE — Progress Notes (Signed)
After talking with Dr. Allena KatzPatel, he states that patient does have COPD.

## 2016-05-07 NOTE — Progress Notes (Signed)
Pt slept well overnight, once tylenol provided for general body ache, Oxygen continue via nasal canula @2l /min, vitals stable (BP running little softer), will continue to monitor the patient.

## 2016-05-07 NOTE — Progress Notes (Signed)
SATURATION QUALIFICATIONS: (This note is used to comply with regulatory documentation for home oxygen)  Patient Saturations on Room Air at Rest = 78%  Patient Saturations on Room Air while Ambulating = 70%  Patient Saturations on 2 Liters of oxygen while Ambulating = 92%  Please briefly explain why patient needs home oxygen:

## 2016-05-07 NOTE — Care Management Note (Signed)
Case Management Note  Patient Details  Name: Katherine LusterDeborah A Hopkin MRN: [id number] Date of Birth: March 12, 1971  Subjective/Objective:    Admitted with Acute Resp Failure                Action/Plan: Patient lives alone in her apt; [facility]; private insurance with Medicaid with prescription drug coverage; [facility]; patient reports no problem getting her medication; no needs identified at this time; CM will continue to follow for DCP;   Expected Discharge Date:     Possibly 05/07/2016             Expected Discharge Plan:  Home/Self Care  Discharge planning Services  CM Consult    Status of Service:  In process, will continue to follow  Reola MosherChandler, Abeer Iversen L, RN,MHA,BSN [phone] 05/07/2016, 11:42 AM

## 2016-05-10 LAB — CULTURE, BLOOD (ROUTINE X 2)
CULTURE: NO GROWTH
Culture: NO GROWTH

## 2016-06-14 ENCOUNTER — Inpatient Hospital Stay: Payer: Medicaid Other | Admitting: Pulmonary Disease

## 2016-06-23 ENCOUNTER — Ambulatory Visit (INDEPENDENT_AMBULATORY_CARE_PROVIDER_SITE_OTHER): Payer: Medicaid Other | Admitting: Acute Care

## 2016-06-23 ENCOUNTER — Encounter: Payer: Self-pay | Admitting: Acute Care

## 2016-06-23 VITALS — BP 124/66 | HR 77 | Ht 63.5 in | Wt 157.0 lb

## 2016-06-23 DIAGNOSIS — J189 Pneumonia, unspecified organism: Secondary | ICD-10-CM | POA: Diagnosis not present

## 2016-06-23 DIAGNOSIS — F17210 Nicotine dependence, cigarettes, uncomplicated: Secondary | ICD-10-CM | POA: Diagnosis not present

## 2016-06-23 DIAGNOSIS — J441 Chronic obstructive pulmonary disease with (acute) exacerbation: Secondary | ICD-10-CM

## 2016-06-23 DIAGNOSIS — R0902 Hypoxemia: Secondary | ICD-10-CM | POA: Diagnosis not present

## 2016-06-23 DIAGNOSIS — I208 Other forms of angina pectoris: Secondary | ICD-10-CM

## 2016-06-23 DIAGNOSIS — I2089 Other forms of angina pectoris: Secondary | ICD-10-CM

## 2016-06-23 DIAGNOSIS — R079 Chest pain, unspecified: Secondary | ICD-10-CM | POA: Insufficient documentation

## 2016-06-23 DIAGNOSIS — Z720 Tobacco use: Secondary | ICD-10-CM

## 2016-06-23 NOTE — Assessment & Plan Note (Addendum)
Walked in office today. Saturation dropped to 92%, but patient stated she was short of breath Titrate oxygen for saturations of 88-92% Follow up in 4-6 weeks Please contact office for sooner follow up if symptoms do not improve or worsen or seek emergency care   ? ILD CT Chest as follow up. If continues to have persistent infiltrate she may need surgical lung biopsy

## 2016-06-23 NOTE — Assessment & Plan Note (Signed)
Intermittent that resolves with oxygen use No pain today Hyperlipidemia Plan: Cardiology referral

## 2016-06-23 NOTE — Assessment & Plan Note (Signed)
Wean oxygen for saturations 88-92% Do not use oxygen while smoking. You need to quit smoking. Schedule PFT's in 4 weeks, then follow up with Dr. Christene Slatese Dios same day. It is ok to stop prednisone. Continue your Symbicort and Spiriva daily without fail. Rinse mouth after use  Continue to use your rescue inhaler as needed for break through. Please contact office for sooner follow up if symptoms do not improve or worsen or seek emergency care

## 2016-06-23 NOTE — Progress Notes (Addendum)
History of Present Illness Katherine [name] is a 46 y.o. female with PMH of ETOH, tobacco use, hyperlipidemia and schizophrenia. She was seen during a hospital admission from 04/29/2016-05/07/2016 for acute hypoxic respiratory failure possibly due to pneumonia vs. Viral illness.She presents for hospital follow up.   06/23/2016  Hospital Follow Up: Pt. Presents for hospital follow up of acute respiratory failure secondary to pneumonia/ ? ILD / ? viral etiology.Marland Kitchen She was treated with ABX, steroids, oxygen, scheduled BD's , prn BIPAP and lasix. She improved clinically and was discharged home on a prednisone taper and oxygen.She completed her pred taper today. She is not wheezing and states her dyspnea is better. She did not take  prednisone prior to this admission.She is not wearing her oxygen today, but states that she does use it at times. She describes an intermittent  anginal type pain that resolves with oxygen. She does take a statin for hyperlipidemia. 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 We had a long discussion regarding the reasons she needs to quit. We specifically discussed the risk of smoking while wearing oxygen. She verbalized understanding of the risk of explosion and flash burns.She denies fever, orthopnea or hemoptysis. She states she rarely coughs.Secretions are clear to light yellow.She states she is compliant with her symbicort and spiriva daily. She states she uses her rescue inhaler about once daily. We did discuss that she should consider getting a primary care physician.She told me she is seen on a regular basis by [facility].  She states she is feeling better. She weaning herself from her oxygen. She is continuing to smoke 1/2 PPD.She does use her oxygen occasionally for intermittent shortness of breath She has been weaning oxygen. Referral to cardiology for anginal type pain that is intermittent and self resolves with oxygen  use. False left eye  Tests STUDIES:  CXR 1/25 > diffuse airspace disease consistent with diffuse PNA vs ARDS  CXR 1/26 > slight interval improvement of diffuse airspace disease Echo 1/26 > LVEF 55%- 60%, normal wall motion CT chest 1/28 > Diffuse bilateral interstitial opacity with geographic ground-glass opacities throughout the lungs favoring the lung bases, and some confluent dependent airspace opacities in the lower lobes. Secondary pulmonary lobular thickening.   CULTURES: Blood 1/25 > NGTD Sputum 1/25 > none collected RVP 1/25 > negative  Urine 1/25 > negative  ANTIBIOTICS: Azithromycin 1/25 >1/31 Rocephin 1/25 >   -Autoimmune workup: ANA negative, ANCA WNL, CCP WNL, RF 28.3 elevated -ESR and CRP elevated  SIGNIFICANT EVENTS: 1/25  Presents to OSH with severe respiratory distress  1/26 1L IV fluids for mild hypotension overnight > tachypnea.  Then lasix + BiPAP with improvement.    Past medical hx No past medical history on file.   Past surgical hx, Family hx, Social hx all reviewed.  Current Outpatient Prescriptions on File Prior to Visit  Medication Sig  . acetaminophen (TYLENOL) 325 MG tablet Take 650 mg by mouth every 6 (six) hours as needed for moderate pain or headache.  . albuterol (PROVENTIL HFA;VENTOLIN HFA) 108 (90 Base) MCG/ACT inhaler Inhale 2 puffs into the lungs every 6 (six) hours as needed for wheezing or shortness of breath.  . ARIPiprazole (ABILIFY) 5 MG tablet Take 5 mg by mouth daily.  . budesonide-formoterol (SYMBICORT) 80-4.5 MCG/ACT inhaler Inhale 2 puffs into the lungs 2 (two) times daily.  . feeding supplement, ENSURE ENLIVE, (ENSURE ENLIVE) LIQD Take 237 mLs by mouth 2 (two)  times daily between meals.  . gabapentin (NEURONTIN) 300 MG capsule Take 300 mg by mouth 3 (three) times daily.  . sertraline (ZOLOFT) 25 MG tablet Take 25 mg by mouth daily.  . simvastatin (ZOCOR) 20 MG tablet Take 20 mg by mouth daily.  Marland Kitchen thiamine 100 MG tablet Take  1 tablet (100 mg total) by mouth daily.  Marland Kitchen tiotropium (SPIRIVA HANDIHALER) 18 MCG inhalation capsule Place 1 capsule (18 mcg total) into inhaler and inhale daily.  . predniSONE (DELTASONE) 10 MG tablet Take 76m daily for 3days,Take 327mdaily for 3days,Take 2052maily for 3days,Take 73m52mily till seen by pulmonary. (Patient not taking: Reported on 06/23/2016)   No current facility-administered medications on file prior to visit.      Allergies  Allergen Reactions  . Doxycycline Swelling    Review Of Systems:  Constitutional:   No  weight loss, night sweats,  Fevers, chills, fatigue, or  lassitude.  HEENT:   No headaches,  Difficulty swallowing,  Tooth/dental problems, or  Sore throat,                No sneezing, itching, ear ache, nasal congestion, post nasal drip,   CV:  No chest pain,  Orthopnea, PND, swelling in lower extremities, anasarca, dizziness, palpitations, syncope.   GI  No heartburn, indigestion, abdominal pain, nausea, vomiting, diarrhea, change in bowel habits, loss of appetite, bloody stools.   Resp: No shortness of breath with exertion or at rest.  No excess mucus, no productive cough,  No non-productive cough,  No coughing up of blood.  No change in color of mucus.  No wheezing.  No chest wall deformity  Skin: no rash or lesions.  GU: no dysuria, change in color of urine, no urgency or frequency.  No flank pain, no hematuria   MS:  No joint pain or swelling.  No decreased range of motion.  No back pain.  Psych:  No change in mood or affect. No depression or anxiety.  No memory loss.   Vital Signs BP 124/66 (BP Location: Left Arm, Cuff Size: Normal)   Pulse 77   Ht 5' 3.5" (1.613 m)   Wt 157 lb (71.2 kg)   SpO2 98%   BMI 27.38 kg/m    Physical Exam:  General- No distress,  A&Ox3, pleasant, smells of cigarette smoke. ENT: No sinus tenderness, TM clear, pale nasal mucosa, no oral exudate,no post nasal drip, no LAN, left eye droop, pierced  tongue Cardiac: S1, S2, regular rate and rhythm, no murmur Chest: No wheeze/ rales/ dullness; no accessory muscle use, no nasal flaring, no sternal retractions Abd.: Soft Non-tender Ext: No clubbing cyanosis, edema Neuro:  normal strength Skin: No rashes, warm and dry, multiple skin piercing's ( eye brow x 2, lip x 1, tongue x 1, and moth x 2, multiple small gauge  per ears) and multiple tattoos Psych: normal mood and behavior, appropriate and pleasant,   Assessment/Plan  Pneumonia Clinically improved  Plan: We will walk you today in the office to see if you continue to need your oxygen Wean oxygen for saturations of 88-92% Do not use oxygen while smoking. You need to quit smoking. We will schedule your follow up CT Chest without contrast, Follow up Pneumonia and suspected ILD...  Please contact office for sooner follow up if symptoms do not improve or worsen or seek emergency care  CT chest to evaluate ? ILD/ check resolution of infiltrates. Please contact office for sooner follow up if symptoms  do not improve or worsen or seek emergency care   COPD exacerbation ([place]) Wean oxygen for saturations 88-92% Do not use oxygen while smoking. You need to quit smoking. Schedule PFT's in 4 weeks, then follow up with Dr. Corrie Dandy same day. It is ok to stop prednisone. Continue your Symbicort and Spiriva daily without fail. Rinse mouth after use  Continue to use your rescue inhaler as needed for break through. Please contact office for sooner follow up if symptoms do not improve or worsen or seek emergency care    Hypoxia Walked in office today. Saturation dropped to 92%, but patient stated she was short of breath Titrate oxygen for saturations of 88-92% Follow up in 4-6 weeks Please contact office for sooner follow up if symptoms do not improve or worsen or seek emergency care   ? ILD CT Chest as follow up. If continues to have persistent infiltrate she may need surgical lung  biopsy  Tobacco abuse Counseled extensively on smoking cessation and risk of smoking while wearing oxygen  Angina at rest [facility]) Intermittent that resolves with oxygen use No pain today Hyperlipidemia Plan: Cardiology referral    Magdalen Spatz, NP 06/23/2016  5:10 PM

## 2016-06-23 NOTE — Patient Instructions (Addendum)
It is nice to meet you today. We will walk you today in the office to see if you continue to need your oxygen Do not use oxygen while smoking. You need to quit smoking. We will schedule your follow up CT Chest without contrast, Follow up Pneumonia and suspected ILD. Referral to cardiology.( Intermittent anginal like pain) Schedule PFT's in 4 weeks, then follow up with Dr. Christene Slatese [name] same day. It is ok to stop prednisone. Continue your Symbicort and Spiriva daily without fail. Rinse mouth after use  Continue to use your rescue inhaler as needed for break through. Please contact office for sooner follow up if symptoms do not improve or worsen or seek emergency care

## 2016-06-23 NOTE — Assessment & Plan Note (Signed)
Counseled extensively on smoking cessation and risk of smoking while wearing oxygen

## 2016-06-23 NOTE — Assessment & Plan Note (Signed)
Clinically improved  Plan: We will walk you today in the office to see if you continue to need your oxygen Wean oxygen for saturations of 88-92% Do not use oxygen while smoking. You need to quit smoking. We will schedule your follow up CT Chest without contrast, Follow up Pneumonia and suspected ILD...  Please contact office for sooner follow up if symptoms do not improve or worsen or seek emergency care  CT chest to evaluate ? ILD/ check resolution of infiltrates. Please contact office for sooner follow up if symptoms do not improve or worsen or seek emergency care

## 2016-06-24 ENCOUNTER — Telehealth: Payer: Self-pay | Admitting: Acute Care

## 2016-06-24 MED ORDER — TIOTROPIUM BROMIDE MONOHYDRATE 18 MCG IN CAPS: 18.0000 ug | ORAL_CAPSULE | Freq: Every day | RESPIRATORY_TRACT | 5 refills | Status: DC

## 2016-06-24 MED ORDER — BUDESONIDE-FORMOTEROL FUMARATE 80-4.5 MCG/ACT IN AERO: 2.0000 | INHALATION_SPRAY | Freq: Two times a day (BID) | RESPIRATORY_TRACT | 5 refills | Status: DC

## 2016-06-24 NOTE — Telephone Encounter (Signed)
Pt requesting refills on spiriva and symbicort.  These have been sent to preferred pharmacy.  Nothing further needed.

## 2016-06-24 NOTE — Telephone Encounter (Signed)
Pt calling back to check on the status of refill request from earlier this morning, she is @ pharm  [facility] waiting on this she can be reached @ [phone], please advise.Caren GriffinsStanley A Dalton

## 2016-06-25 ENCOUNTER — Encounter: Payer: Self-pay | Admitting: Cardiology

## 2016-06-30 ENCOUNTER — Telehealth: Payer: Self-pay | Admitting: Acute Care

## 2016-06-30 NOTE — Telephone Encounter (Signed)
PA was approved through [employer]. Approval number is [id number]. PA has been approved until 06/24/17.  [facility] is aware. Nothing else is needed at this time.

## 2016-07-08 ENCOUNTER — Encounter: Payer: Self-pay | Admitting: Cardiology

## 2016-07-08 ENCOUNTER — Ambulatory Visit (INDEPENDENT_AMBULATORY_CARE_PROVIDER_SITE_OTHER): Payer: Medicaid Other | Admitting: Cardiology

## 2016-07-08 ENCOUNTER — Ambulatory Visit (INDEPENDENT_AMBULATORY_CARE_PROVIDER_SITE_OTHER)
Admission: RE | Admit: 2016-07-08 | Discharge: 2016-07-08 | Disposition: A | Discharge status: Home / Self Care | Payer: Medicaid Other | Source: Ambulatory Visit | Attending: Acute Care | Admitting: Acute Care

## 2016-07-08 VITALS — BP 124/68 | HR 85 | Ht 63.5 in | Wt 153.4 lb

## 2016-07-08 DIAGNOSIS — R079 Chest pain, unspecified: Secondary | ICD-10-CM | POA: Diagnosis not present

## 2016-07-08 DIAGNOSIS — Z720 Tobacco use: Secondary | ICD-10-CM | POA: Diagnosis not present

## 2016-07-08 DIAGNOSIS — J189 Pneumonia, unspecified organism: Secondary | ICD-10-CM

## 2016-07-08 NOTE — Progress Notes (Signed)
Cardiology Office Note    Date:  07/08/2016   ID:  SHAWNE BULOW, DOB 03/16/1971, MRN [id number]  PCP:  [facility]  Cardiologist:  Fransico Him, MD   Chief Complaint  Patient presents with  . Chest Pain    History of Present Illness:  Katherine [name] is a 46 y.o. female with a history of COPD, ETOH (now only socially) and tobacco abuse, hyperlipidemia and schizoprhenia who is referred by Eric Form, NP for evaluation of chest pain.  She was admitted to the hospital 06/2016 with acute respiratory failure secondary to PNA/?ILD and was tx with antibiotics, steroids, O2 and bronchodilators.  Her RF, ESR and CRP were elevated.  2D echo was normal in January 2018. She has PFTs pending.   She was seen back by her NP and complained of intermittent CP.  She smokes 1/2ppd and is on statin therapy for her hyperlipidemia.  She says that the her pain is midsternal and to the left side and has been occurring for about 8 months.  It is intermittent and lasts anywhere from a few minutes up to an hour.  She describes it as a squeezing pain.  It is exertional and nonexertional.  She has a lot of stress with anxiety and says that it could be related to stress.  She occasionally will have diaphoresis and nausea with the chest discomfort.  There is no radiation of the pain.  She has chronic SOB due to her COPD but thinks that when she gets the CP her SOB gets worse.  Since her PNA, she has also had discomfort in her lower rib cage on both sides of her chest that is similar to the midsternal discomfort she has had.  She denies any orthopnea, PND or syncope.  She has had some LE edema in the past but none recently. She also has noticed skipping of her heart beat and then takes off beating very fast.  EKG in hospital showed NSR with IRBBB.    Past Medical History:  Diagnosis Date  . Angina at rest [facility])   . COPD (chronic obstructive pulmonary disease) ([place])   . ETOH abuse   . Hyperlipidemia   .  Hypoxia   . Schizophrenia ([place])   . Tobacco use     History reviewed. No pertinent surgical history.  Current Medications: Current Meds  Medication Sig  . acetaminophen (TYLENOL) 325 MG tablet Take 650 mg by mouth every 6 (six) hours as needed for moderate pain or headache.  . albuterol (PROVENTIL HFA;VENTOLIN HFA) 108 (90 Base) MCG/ACT inhaler Inhale 2 puffs into the lungs every 6 (six) hours as needed for wheezing or shortness of breath.  . ARIPiprazole (ABILIFY) 5 MG tablet Take 5 mg by mouth daily.  . budesonide-formoterol (SYMBICORT) 80-4.5 MCG/ACT inhaler Inhale 2 puffs into the lungs 2 (two) times daily.  Marland Kitchen gabapentin (NEURONTIN) 300 MG capsule Take 300 mg by mouth 3 (three) times daily.  . predniSONE (DELTASONE) 10 MG tablet Take 106m daily for 3days,Take 37mdaily for 3days,Take 2090maily for 3days,Take 14m34mily till seen by pulmonary.  . sertraline (ZOLOFT) 25 MG tablet Take 25 mg by mouth daily.  . simvastatin (ZOCOR) 20 MG tablet Take 20 mg by mouth daily.  . tiMarland Kitchentropium (SPIRIVA HANDIHALER) 18 MCG inhalation capsule Place 1 capsule (18 mcg total) into inhaler and inhale daily.    Allergies:   Doxycycline   Social History   Social History  . Marital status: Divorced  Spouse name: N/A  . Number of children: N/A  . Years of education: N/A   Social History Main Topics  . Smoking status: Current Every Day Smoker    Packs/day: 0.25    Years: 30.00    Types: Cigarettes  . Smokeless tobacco: Never Used     Comment: down to 0.5ppd   . Alcohol use Yes     Comment: socially  . Drug use: No  . Sexual activity: Not Asked   Other Topics Concern  . None   Social History Narrative  . None     Family History:  The patient's family history includes Diabetes in her mother; Hypertension in her father; Lung cancer in her father.   ROS:   Please see the history of present illness.    Review of Systems  Respiratory: Positive for cough and shortness of breath.     Gastrointestinal: Positive for constipation.  Neurological: Positive for dizziness and headaches.  Psychiatric/Behavioral: The patient is nervous/anxious.    All other systems reviewed and are negative.  No flowsheet data found.     PHYSICAL EXAM:   VS:  BP 124/68   Pulse 85   Ht 5' 3.5" (1.613 m)   Wt 153 lb 6.4 oz (69.6 kg)   SpO2 96%   BMI 26.75 kg/m    GEN: Well nourished, well developed, in no acute distress  HEENT: normal  Neck: no JVD, carotid bruits, or masses Cardiac: RRR; no murmurs, rubs, or gallops,no edema.  Intact distal pulses bilaterally.  Respiratory:  clear to auscultation bilaterally, normal work of breathing GI: soft, nontender, nondistended, + BS MS: no deformity or atrophy  Skin: warm and dry, no rash Neuro:  Alert and Oriented x 3, Strength and sensation are intact Psych: euthymic mood, full affect  Wt Readings from Last 3 Encounters:  07/08/16 153 lb 6.4 oz (69.6 kg)  06/23/16 157 lb (71.2 kg)  05/07/16 151 lb 14.4 oz (68.9 kg)      Studies/Labs Reviewed:   EKG:  EKG is not ordered today.    Recent Labs: 05/04/2016: Magnesium 2.0 05/07/2016: ALT 27; BUN 8; Creatinine, Ser 0.84; Hemoglobin 11.3; Platelets 150; Potassium 3.0; Sodium 135   Lipid Panel No results found for: CHOL, TRIG, HDL, CHOLHDL, VLDL, LDLCALC, LDLDIRECT  Additional studies/ records that were reviewed today include:  Hospital and office notes from NP    ASSESSMENT:    1. Chest pain, unspecified type   2. Tobacco abuse      PLAN:  In order of problems listed above:  1. Chest pain - atypical in that it is exertional and nonexertional but is associated, at times, with diaphoresis and nausea.  She also has had an increase in SOB, although this could be related to underlying lung disease.  2D echo was normal and EKG is nonischemic.  Her only CRF is smoking.   I will set her up for stress myoview to rule out ischemia.  2. Tobacco abuse - she has cut down significantly on  her smoking and is determined to quit.      Medication Adjustments/Labs and Tests Ordered: Current medicines are reviewed at length with the patient today.  Concerns regarding medicines are outlined above.  Medication changes, Labs and Tests ordered today are listed in the Patient Instructions below.  There are no Patient Instructions on file for this visit.   Signed, Fransico Him, MD  07/08/2016 10:42 AM    [facility]  [address] [place], [place]  [address] Phone: [phone]; Fax: [phone]

## 2016-07-08 NOTE — Patient Instructions (Signed)
Medication Instructions:  Your physician recommends that you continue on your current medications as directed. Please refer to the Current Medication list given to you today.   Labwork: None  Testing/Procedures: Dr. Turner recommends you have a NUCLEAR STRESS TEST.  Follow-Up: Your physician recommends that you schedule a follow-up appointment AS NEEDED with Dr. Turner pending study results.  Any Other Special Instructions Will Be Listed Below (If Applicable).     If you need a refill on your cardiac medications before your next appointment, please call your pharmacy.   

## 2016-07-14 ENCOUNTER — Ambulatory Visit (HOSPITAL_COMMUNITY): Payer: Medicaid Other | Attending: Cardiology

## 2016-07-14 DIAGNOSIS — I2510 Atherosclerotic heart disease of native coronary artery without angina pectoris: Secondary | ICD-10-CM | POA: Insufficient documentation

## 2016-07-14 DIAGNOSIS — R079 Chest pain, unspecified: Secondary | ICD-10-CM

## 2016-07-14 DIAGNOSIS — R0602 Shortness of breath: Secondary | ICD-10-CM | POA: Insufficient documentation

## 2016-07-14 LAB — MYOCARDIAL PERFUSION IMAGING
CHL CUP NUCLEAR SRS: 10
CHL CUP RESTING HR STRESS: 76 {beats}/min
CHL RATE OF PERCEIVED EXERTION: 19
CSEPEDS: 0 s
CSEPEW: 5.1 METS
CSEPPHR: 112 {beats}/min
Exercise duration (min): 3 min
LVDIAVOL: 72 mL (ref 46–106)
LVSYSVOL: 22 mL
MPHR: 174 {beats}/min
NUC STRESS TID: 0.97
Percent HR: 64 %
RATE: 0.37
SDS: 5
SSS: 15

## 2016-07-14 MED ORDER — TECHNETIUM TC 99M TETROFOSMIN IV KIT
10.2000 | PACK | Freq: Once | INTRAVENOUS | Status: AC | PRN
  Administered 2016-07-14: 10.2 via INTRAVENOUS
  Filled 2016-07-14: qty 11

## 2016-07-14 MED ORDER — TECHNETIUM TC 99M TETROFOSMIN IV KIT
32.7000 | PACK | Freq: Once | INTRAVENOUS | Status: AC | PRN
  Administered 2016-07-14: 32.7 via INTRAVENOUS
  Filled 2016-07-14: qty 33

## 2016-07-14 MED ORDER — REGADENOSON 0.4 MG/5ML IV SOLN
0.4000 mg | Freq: Once | INTRAVENOUS | Status: AC
  Administered 2016-07-14: 0.4 mg via INTRAVENOUS

## 2016-07-26 ENCOUNTER — Other Ambulatory Visit: Payer: Self-pay

## 2016-07-26 DIAGNOSIS — J441 Chronic obstructive pulmonary disease with (acute) exacerbation: Secondary | ICD-10-CM

## 2016-08-02 ENCOUNTER — Ambulatory Visit: Payer: Medicaid Other | Admitting: Internal Medicine

## 2016-09-24 ENCOUNTER — Encounter: Payer: Self-pay | Admitting: Internal Medicine

## 2016-09-24 ENCOUNTER — Ambulatory Visit (INDEPENDENT_AMBULATORY_CARE_PROVIDER_SITE_OTHER): Payer: Medicaid Other | Admitting: Internal Medicine

## 2016-09-24 ENCOUNTER — Ambulatory Visit (INDEPENDENT_AMBULATORY_CARE_PROVIDER_SITE_OTHER)
Admission: RE | Admit: 2016-09-24 | Discharge: 2016-09-24 | Disposition: A | Discharge status: Home / Self Care | Payer: Medicaid Other | Source: Ambulatory Visit | Attending: Internal Medicine | Admitting: Internal Medicine

## 2016-09-24 VITALS — BP 104/62 | HR 82 | Ht 64.0 in | Wt 146.0 lb

## 2016-09-24 DIAGNOSIS — J9601 Acute respiratory failure with hypoxia: Secondary | ICD-10-CM | POA: Diagnosis not present

## 2016-09-24 DIAGNOSIS — J449 Chronic obstructive pulmonary disease, unspecified: Secondary | ICD-10-CM | POA: Diagnosis not present

## 2016-09-24 DIAGNOSIS — J441 Chronic obstructive pulmonary disease with (acute) exacerbation: Secondary | ICD-10-CM

## 2016-09-24 DIAGNOSIS — F17210 Nicotine dependence, cigarettes, uncomplicated: Secondary | ICD-10-CM

## 2016-09-24 LAB — PULMONARY FUNCTION TEST
DL/VA % pred: 55 %
DL/VA: 2.64 ml/min/mmHg/L
DLCO COR % PRED: 38 %
DLCO COR: 9.43 ml/min/mmHg
DLCO UNC: 9.74 ml/min/mmHg
DLCO unc % pred: 40 %
FEF 25-75 POST: 2.22 L/s
FEF 25-75 Pre: 2.21 L/sec
FEF2575-%Change-Post: 0 %
FEF2575-%PRED-POST: 76 %
FEF2575-%PRED-PRE: 76 %
FEV1-%Change-Post: -1 %
FEV1-%PRED-POST: 67 %
FEV1-%Pred-Pre: 68 %
FEV1-POST: 1.94 L
FEV1-Pre: 1.97 L
FEV1FVC-%CHANGE-POST: 2 %
FEV1FVC-%PRED-PRE: 104 %
FEV6-%CHANGE-POST: -3 %
FEV6-%Pred-Post: 63 %
FEV6-%Pred-Pre: 66 %
FEV6-Post: 2.24 L
FEV6-Pre: 2.33 L
FEV6FVC-%Pred-Post: 102 %
FEV6FVC-%Pred-Pre: 102 %
FVC-%CHANGE-POST: -3 %
FVC-%PRED-PRE: 64 %
FVC-%Pred-Post: 62 %
FVC-POST: 2.24 L
FVC-PRE: 2.33 L
PRE FEV1/FVC RATIO: 84 %
Post FEV1/FVC ratio: 87 %
Post FEV6/FVC ratio: 100 %
Pre FEV6/FVC Ratio: 100 %
RV % pred: 105 %
RV: 1.81 L
TLC % pred: 79 %
TLC: 4.04 L

## 2016-09-24 MED ORDER — BUDESONIDE-FORMOTEROL FUMARATE 80-4.5 MCG/ACT IN AERO: 2.0000 | INHALATION_SPRAY | Freq: Two times a day (BID) | RESPIRATORY_TRACT | 0 refills | Status: DC

## 2016-09-24 NOTE — Patient Instructions (Addendum)
The key is to stop smoking completely before smoking completely stops you!   Plan A = Automatic = stop spiriva and continue symbicort 80 Take 2 puffs first thing in am and then another 2 puffs about 12 hours later.   Work on inhaler technique:  relax and gently blow all the way out then take a nice smooth deep breath back in, triggering the inhaler at same time you start breathing in.  Hold for up to 5 seconds if you can. Blow out thru nose. Rinse and gargle with water when done      Plan B = Backup Only use your albuterol as a rescue medication to be used if you can't catch your breath by resting or doing a relaxed purse lip breathing pattern.  - The less you use it, the better it will work when you need it. - Ok to use the inhaler up to 2 puffs  every 4 hours if you must but call for appointment if use goes up over your usual need - Don't leave home without it !!  (think of it like the spare tire for your car)   Please remember to go to the  x-ray department downstairs in the basement  for your tests - we will call you with the results when they are available.      Please schedule a follow up visit in 3 months but call sooner if needed  with all medications /inhalers/ solutions in hand so we can verify exactly what you are taking. This includes all medications from all doctors and over the counters Add no need for 02

## 2016-09-24 NOTE — Progress Notes (Signed)
History of Present Illness Katherine LusterDeborah A Delatte is a 46 y.o. female with PMH of ETOH, tobacco use, hyperlipidemia and schizophrenia. She was seen during a hospital admission from 04/29/2016-05/07/2016 for acute hypoxic respiratory failure possibly due to pneumonia vs. Viral illness.    06/23/2016 NP note Hospital Follow Up: Pt. Presents for hospital follow up of acute respiratory failure secondary to pneumonia/ ? ILD / ? viral etiology.Marland Kitchen. She was treated with ABX, steroids, oxygen, scheduled BD's , prn BIPAP and lasix. She improved clinically and was discharged home on a prednisone taper and oxygen.She completed her pred taper today. She is not wheezing and states her dyspnea is better. She did not take  prednisone prior to this admission.She is not wearing her oxygen today, but states that she does use it at times. She describes an intermittent  anginal type pain that resolves with oxygen. She does take a statin for hyperlipidemia. 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 We had a long discussion regarding the reasons she needs to quit. We specifically discussed the risk of smoking while wearing oxygen. She verbalized understanding of the risk of explosion and flash burns.She denies fever, orthopnea or hemoptysis. She states she rarely coughs.Secretions are clear to light yellow.She states she is compliant with her symbicort and spiriva daily. She states she uses her rescue inhaler about once daily. We did discuss that she should consider getting a primary care physician.She told me [facility]  She states she is feeling better. She weaning herself from her oxygen. She is continuing to smoke 1/2 PPD.She does use her oxygen occasionally for intermittent shortness of breath She has been weaning oxygen. Referral to cardiology for anginal type pain that is intermittent and self resolves with oxygen use. False left  eye rec It is nice to meet you today. We will walk you today in the office to see if you continue to need your oxygen Do not use oxygen while smoking. You need to quit smoking. We will schedule your follow up CT Chest without contrast, Follow up Pneumonia and suspected ILD. Referral to cardiology.( Intermittent anginal like pain) Schedule PFT's in 4 weeks, then follow up with Dr. Christene Slatese Dios same day. It is ok to stop prednisone. Continue your Symbicort and Spiriva daily without fail. Rinse mouth after use  Continue to use your rescue inhaler as needed for break through. 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 Follow-up    PFT's done today.  Breathing has improved, but not back at her normal baseline yet. She is using her albuterol inhaler 5 x per wk on average.   doe still MMRC3 = can't walk 100 yards even at a slow pace at a flat grade s stopping due to sob eg [employer] walking / [facility] ? Some better p saba   No obvious day to day or daytime variability or assoc excess/ purulent sputum or mucus plugs or hemoptysis or cp or chest tightness, subjective wheeze or overt sinus or hb symptoms. No unusual exp hx or h/o childhood pna/ asthma or knowledge of premature birth.  Sleeping ok without nocturnal  or early am exacerbation  of respiratory  c/o's or need  for noct saba. Also denies any obvious fluctuation of symptoms with weather or environmental changes or other aggravating or alleviating factors except as outlined above   Current Medications, Allergies, Complete Past Medical History, Past Surgical History, Family History, and Social History were reviewed in [employer] record.  ROS  The following are not active complaints unless bolded sore throat, dysphagia, dental problems,  itching, sneezing,  nasal congestion or excess/ purulent secretions, ear ache,   fever, chills, sweats, unintended wt loss, classically pleuritic or exertional cp,  orthopnea pnd or leg swelling, presyncope, palpitations, abdominal pain, anorexia, nausea, vomiting, diarrhea  or change in bowel or bladder habits, change in stools or urine, dysuria,hematuria,  rash, arthralgias, visual complaints, headache, numbness, weakness or ataxia or problems with walking or coordination,  change in mood/affect or memory.           Tests STUDIES:  CXR 1/25 > diffuse airspace disease consistent with diffuse PNA vs ARDS  CXR 1/26 > slight interval improvement of diffuse airspace disease Echo 1/26 > LVEF 55%- 60%, normal wall motion  CXR PA and Lateral:   09/24/2016 :    I personally reviewed images and agree with radiology impression as follows:    No active cardiopulmonary disease.         Physical Exam:  amb wf nad   Wt Readings from Last 3 Encounters:  09/24/16 146 lb (66.2 kg)  07/14/16 153 lb (69.4 kg)  07/08/16 153 lb 6.4 oz (69.6 kg)    Vital signs reviewed   - Note on arrival 02 sats  100% on RA     HEENT: nl dentition, turbinates bilaterally, and oropharynx. Nl external ear canals without cough reflex   NECK :  without JVD/Nodes/TM/ nl carotid upstrokes bilaterally   LUNGS: no acc muscle use,  Nl contour chest which is clear to A and P bilaterally without cough on insp or exp maneuvers   CV:  RRR  no s3 or murmur or increase in P2, and no edema   ABD:  soft and nontender with nl inspiratory excursion in the supine position. No bruits or organomegaly appreciated, bowel sounds nl  MS:  Nl gait/ ext warm without deformities, calf tenderness, cyanosis or clubbing No obvious joint restrictions   SKIN: warm and dry without lesions    NEURO:  alert, approp, nl sensorium with  no motor or cerebellar deficits apparent.

## 2016-09-24 NOTE — Progress Notes (Signed)
PFT done today. 

## 2016-09-26 NOTE — Assessment & Plan Note (Signed)
09/24/2016  After extensive coaching HFA effectiveness =    75% from a baseline of 25% so d/c spiriva/ continue symb 80 - PFT's  09/24/2016  FEV1 1.94 (67 % ) ratio 87  p no % improvement from saba p nothing prior to study with DLCO  40/38 % corrects to 55  % for alv volume     Symptoms are markedly disproportionate to objective findings and not clear this is actually much of a  lung problem but pt does appear to have difficult to sort out respiratory symptoms of unknown origin for which  DDX  = almost all start with A and  include Adherence, Ace Inhibitors, Acid Reflux, Active Sinus Disease, Alpha 1 Antitripsin deficiency, Anxiety masquerading as Airways dz,  ABPA,  Allergy(esp in young), Aspiration (esp in elderly), Adverse effects of meds,  Active smokers, A bunch of PE's (a small clot burden can't cause this syndrome unless there is already severe underlying pulm or vascular dz with poor reserve) plus two Bs  = Bronchiectasis and Beta blocker use..and one C= CHF     Adherence is always the initial "prime suspect" and is a multilayered concern that requires a "trust but verify" approach in every patient - starting with knowing how to use medications, especially inhalers, correctly, keeping up with refills and understanding the fundamental difference between maintenance and prns vs those medications only taken for a very short course and then stopped and not refilled.  - see hfa teaching - return with all meds in hand using a trust but verify approach to confirm accurate Medication  Reconciliation The principal here is that until we are certain that the  patients are doing what we've asked, it makes no sense to ask them to do more.   Active smoking - see a/p  ? Anxiety/depression/ deconditioning >  usually at the bottom of this list of usual suspects but should be much higher on this pt's based on H and P and note already on psychotropics .    Will see back q 3 months and try off spiriva in meantime,  focus on smoking cessation

## 2016-09-26 NOTE — Assessment & Plan Note (Signed)
09/24/2016  Walked RA x 3 laps @ 185 ft each stopped due to  End of study, her pace(slow side of nl), no sob or desat  > no need for 02

## 2016-09-26 NOTE — Assessment & Plan Note (Signed)

## 2016-09-27 ENCOUNTER — Other Ambulatory Visit: Payer: Self-pay | Admitting: Internal Medicine

## 2016-09-27 DIAGNOSIS — J9601 Acute respiratory failure with hypoxia: Secondary | ICD-10-CM

## 2016-09-27 NOTE — Progress Notes (Signed)
Spoke with pt and notified of results per Dr. [name]. Pt verbalized understanding and denied any questions. 

## 2016-12-27 ENCOUNTER — Ambulatory Visit: Payer: Medicaid Other | Admitting: Internal Medicine

## 2017-01-18 ENCOUNTER — Encounter: Payer: Self-pay | Admitting: Internal Medicine

## 2017-01-18 ENCOUNTER — Ambulatory Visit (INDEPENDENT_AMBULATORY_CARE_PROVIDER_SITE_OTHER): Payer: Medicaid Other | Admitting: Internal Medicine

## 2017-01-18 VITALS — BP 98/60 | HR 72 | Ht 63.5 in | Wt 156.0 lb

## 2017-01-18 DIAGNOSIS — Z23 Encounter for immunization: Secondary | ICD-10-CM | POA: Diagnosis not present

## 2017-01-18 DIAGNOSIS — J449 Chronic obstructive pulmonary disease, unspecified: Secondary | ICD-10-CM | POA: Diagnosis not present

## 2017-01-18 DIAGNOSIS — F17210 Nicotine dependence, cigarettes, uncomplicated: Secondary | ICD-10-CM

## 2017-01-18 MED ORDER — BUDESONIDE-FORMOTEROL FUMARATE 80-4.5 MCG/ACT IN AERO: 2.0000 | INHALATION_SPRAY | Freq: Two times a day (BID) | RESPIRATORY_TRACT | 0 refills | Status: DC

## 2017-01-18 NOTE — Patient Instructions (Addendum)
Plan A = Automatic = continue symbicort 80 Take 2 puffs first thing in am and then another 2 puffs about 12 hours later.   Work on inhaler technique:  relax and gently blow all the way out then take a nice smooth deep breath back in, triggering the inhaler at same time you start breathing in.  Hold for up to 5 seconds if you can. Blow out thru nose. Rinse and gargle with water when done     Plan B = Backup Only use your albuterol as a rescue medication to be used if you can't catch your breath by resting or doing a relaxed purse lip breathing pattern.  - The less you use it, the better it will work when you need it. - Ok to use the inhaler up to 2 puffs  every 4 hours if you must but call for appointment if use goes up over your usual need - Don't leave home without it !!  (think of it like the spare tire for your car)   The key is to stop smoking completely before smoking completely stops you!    If you are satisfied with your treatment plan,  let your doctor know and he/she can either refill your medications or you can return here when your prescription runs out.     If in any way you are not 100% satisfied,  please tell us.  If 100% better, tell your friends!  Pulmonary follow up is as needed

## 2017-01-18 NOTE — Progress Notes (Addendum)
History of Present Illness Katherine [name] is a 46 y.o. female with PMH of ETOH, tobacco use, hyperlipidemia and schizophrenia. She was seen during a hospital admission from 04/29/2016-05/07/2016 for acute hypoxic respiratory failure possibly due to pneumonia vs. Viral illness.    06/23/2016 NP note Hospital Follow Up: Pt. Presents for hospital follow up of acute respiratory failure secondary to pneumonia/ ? ILD / ? viral etiology.Marland Kitchen She was treated with ABX, steroids, oxygen, scheduled BD's , prn BIPAP and lasix. She improved clinically and was discharged home on a prednisone taper and oxygen.She completed her pred taper today. She is not wheezing and states her dyspnea is better. She did not take  prednisone prior to this admission.She is not wearing her oxygen today, but states that she does use it at times. She describes an intermittent  anginal type pain that resolves with oxygen. She does take a statin for hyperlipidemia. 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 We had a long discussion regarding the reasons she needs to quit. We specifically discussed the risk of smoking while wearing oxygen. She verbalized understanding of the risk of explosion and flash burns.She denies fever, orthopnea or hemoptysis. She states she rarely coughs.Secretions are clear to light yellow.She states she is compliant with her symbicort and spiriva daily. She states she uses her rescue inhaler about once daily. We did discuss that she should consider getting a primary care physician.She told me she is seen on a regular basis by [facility].  She states she is feeling better. She weaning herself from her oxygen. She is continuing to smoke 1/2 PPD.She does use her oxygen occasionally for intermittent shortness of breath She has been weaning oxygen. Referral to cardiology for anginal type pain that is intermittent and self resolves with oxygen use. False left  eye rec It is nice to meet you today. We will walk you today in the office to see if you continue to need your oxygen Do not use oxygen while smoking. You need to quit smoking. We will schedule your follow up CT Chest without contrast, Follow up Pneumonia and suspected ILD. Referral to cardiology.( Intermittent anginal like pain) Schedule PFT's in 4 weeks, then follow up with Dr. Christene Slates same day. It is ok to stop prednisone. Continue your Symbicort and Spiriva daily without fail. Rinse mouth after use  Continue to use your rescue inhaler as needed for break through. 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 Follow-up    PFT's done today.  Breathing has improved, but not back at her normal baseline yet. She is using her albuterol inhaler 5 x per wk on average.   doe still MMRC3 = can't walk 100 yards even at a slow pace at a flat grade s stopping due to sob eg [employer] walking / does ok at [facility] ? Some better p saba  rec The key is to stop smoking completely before smoking completely stops you!  Plan A = Automatic = stop spiriva and continue symbicort 80 Take 2 puffs first thing in am and then another 2 puffs about 12 hours later.  Work on inhaler technique:  Plan B = Backup Only use your albuterol as a rescue medication  Pease remember to  go to the  x-ray department downstairs in the basement  for your tests - we will call you with the results when they are available. Please schedule a follow up visit in 3 months but call sooner if needed  with all medications /inhalers/ solutions in hand so we can verify exactly what you are taking. This includes all medications from all doctors and over the counters Add no need for 02     01/18/2017  f/u ov/Trinten Boudoin re: GOLD 0 copd / symb 80 2bid  Chief Complaint   Patient presents with  . Follow-up    Breathing is doing better, back to baseline. She is having some increased chest congestion and says her PCP advised her to use her albuterol inhaler 4 x daily to help with this. She has been coughing up some yellow sputum.    cough/ congestion worse in am/better on mucinex and more saba though hfa poor technique/ still smoking 1/4 ppd Mucus is sev tsp slt yellowish  No obvious day to day or daytime variability or assoc   mucus plugs or hemoptysis or cp or chest tightness, subjective wheeze or overt sinus or hb symptoms. No unusual exp hx or h/o childhood pna/ asthma or knowledge of premature birth.  Sleeping ok flat without nocturnal  or early am exacerbation  of respiratory  c/o's or need for noct saba. Also denies any obvious fluctuation of symptoms with weather or environmental changes or other aggravating or alleviating factors except as outlined above    Current Allergies, Complete Past Medical History, Past Surgical History, Family History, and Social History were reviewed in [employer] record.  ROS  The following are not active complaints unless bolded Hoarseness, sore throat, dysphagia, dental problems, itching, sneezing,  nasal congestion or discharge of excess mucus or purulent secretions, ear ache,   fever, chills, sweats, unintended wt loss or wt gain, classically pleuritic or exertional cp,  orthopnea pnd or leg swelling, presyncope, palpitations, abdominal pain, anorexia, nausea, vomiting, diarrhea  or change in bowel habits or change in bladder habits, change in stools or change in urine, dysuria, hematuria,  rash, arthralgias, visual complaints, headache, numbness, weakness or ataxia or problems with walking or coordination,  change in mood/affect or memory.        Current Meds  Medication Sig  . acetaminophen (TYLENOL) 325 MG tablet Take 650 mg by mouth every 6 (six) hours as needed for moderate pain or headache.  .  albuterol (PROVENTIL HFA;VENTOLIN HFA) 108 (90 Base) MCG/ACT inhaler Inhale 2 puffs into the lungs every 6 (six) hours as needed for wheezing or shortness of breath.  . ARIPiprazole (ABILIFY) 5 MG tablet Take 5 mg by mouth daily.  . budesonide-formoterol (SYMBICORT) 80-4.5 MCG/ACT inhaler Inhale 2 puffs into the lungs 2 (two) times daily.  . cetirizine (ZYRTEC) 10 MG tablet Take 10 mg by mouth daily.  . fluticasone (FLONASE) 50 MCG/ACT nasal spray Place 2 sprays into both nostrils daily.  Marland Kitchen gabapentin (NEURONTIN) 300 MG capsule Take 300 mg by mouth 3 (three) times daily.  Marland Kitchen guaiFENesin (MUCINEX) 600 MG 12 hr tablet Take 1,200 mg by mouth 2 (two) times daily.  . sertraline (ZOLOFT) 25 MG tablet Take 25 mg by mouth daily.  . simvastatin (ZOCOR) 20 MG tablet Take 20 mg by mouth daily.  . [DISCONTINUED] budesonide-formoterol (SYMBICORT) 80-4.5 MCG/ACT inhaler Inhale 2 puffs into the lungs 2 (two) times daily.          STUDIES:  Echo 1/26 >  LVEF 55%- 60%, normal wall motion       Physical Exam:  amb wf nad   Wt Readings from Last 3 Encounters:  09/24/16 146 lb (66.2 kg)  07/14/16 153 lb (69.4 kg)  07/08/16 153 lb 6.4 oz (69.6 kg)    Vital signs reviewed   - Note on arrival 02 sats 99% on RA     HEENT: nl dentition,   oropharynx. Nl external ear canals without cough reflex - moderate/severe  bilateral non-specific turbinate edema  With mucoid secretions   NECK :  without JVD/Nodes/TM/ nl carotid upstrokes bilaterally   LUNGS: no acc muscle use,  Nl contour chest which is clear to A and P bilaterally without cough on insp or exp maneuvers   CV:  RRR  no s3 or murmur or increase in P2, and no edema   ABD:  soft and nontender with nl inspiratory excursion in the supine position. No bruits or organomegaly appreciated, bowel sounds nl  MS:  Nl gait/ ext warm without deformities, calf tenderness, cyanosis or clubbing No obvious joint restrictions   SKIN: warm and dry without  lesions    NEURO:  alert, approp, nl sensorium with  no motor or cerebellar deficits apparent.            I personally reviewed images and agree with radiology impression as follows:  CXR:   09/24/16  No active cardiopulmonary disease.

## 2017-01-18 NOTE — Assessment & Plan Note (Addendum)
09/24/2016  d/c spiriva/ continue symb 80 - PFT's  09/24/2016  FEV1 1.94 (67 % ) ratio 87  p no % improvement from saba p nothing prior to study with DLCO  40/38 % corrects to 55  % for alv volume   - 01/18/2017  After extensive coaching HFA effectiveness =    75% from a baseline of 25%   DDX of  difficult airways management almost all start with A and  include Adherence, Ace Inhibitors, Acid Reflux, Active Sinus Disease, Alpha 1 Antitripsin deficiency, Anxiety masquerading as Airways dz,  ABPA,  Allergy(esp in young), Aspiration (esp in elderly), Adverse effects of meds,  Active smokers, A bunch of PE's (a small clot burden can't cause this syndrome unless there is already severe underlying pulm or vascular dz with poor reserve) plus two Bs  = Bronchiectasis and Beta blocker use..and one C= CHF  Adherence is always the initial "prime suspect" and is a multilayered concern that requires a "trust but verify" approach in every patient - starting with knowing how to use medications, especially inhalers, correctly, keeping up with refills and understanding the fundamental difference between maintenance and prns vs those medications only taken for a very short course and then stopped and not refilled.  - did not bring meds as req - see hfa teaching  Active smoking (see separate a/p)   ? Allergy/ rhinitis > rx zyrtec prn  ? Anxiety > usually at the bottom of this list of usual suspects but should be much higher on this pt's based on H and P and note already on psychotropics / may interfere with efforts to stop smoking and adherence/ following written instruction plan   I had an extended discussion with the patient reviewing all relevant studies completed to date and  lasting 15 to 20 minutes of a 25 minute visit    Since she is so mild and still smoking the main effort should be on stopping cigs, not escalating pulmonary rx so f/u here can be prn   Each maintenance medication was reviewed in detail  including most importantly the difference between maintenance and prns and under what circumstances the prns are to be triggered using an action plan format that is not reflected in the computer generated alphabetically organized AVS.    Please see AVS for specific instructions unique to this visit that I personally wrote and verbalized to the the pt in detail and then reviewed with pt  by my nurse highlighting any  changes in therapy recommended at today's visit to their plan of care.

## 2017-01-18 NOTE — Assessment & Plan Note (Signed)

## 2017-02-01 ENCOUNTER — Other Ambulatory Visit: Payer: Self-pay | Admitting: Acute Care

## 2017-03-03 ENCOUNTER — Other Ambulatory Visit: Payer: Self-pay | Admitting: Acute Care

## 2017-03-31 ENCOUNTER — Other Ambulatory Visit: Payer: Self-pay | Admitting: Acute Care

## 2017-05-31 ENCOUNTER — Other Ambulatory Visit: Payer: Self-pay | Admitting: Acute Care

## 2017-06-21 ENCOUNTER — Telehealth: Payer: Self-pay

## 2017-06-21 NOTE — Telephone Encounter (Signed)
I called pt to schedule an appt to see MW because her insurance denied the CT. They are requesting an OV with chest xray first in order to approve the scan. I made an appt with MW on 06/27/17 at 9:45 am. Nothing further is needed.

## 2017-06-24 ENCOUNTER — Other Ambulatory Visit: Payer: Self-pay

## 2017-06-24 DIAGNOSIS — J441 Chronic obstructive pulmonary disease with (acute) exacerbation: Secondary | ICD-10-CM

## 2017-06-27 ENCOUNTER — Ambulatory Visit (INDEPENDENT_AMBULATORY_CARE_PROVIDER_SITE_OTHER)
Admission: RE | Admit: 2017-06-27 | Discharge: 2017-06-27 | Disposition: A | Discharge status: Home / Self Care | Payer: Medicaid Other | Source: Ambulatory Visit | Attending: Internal Medicine | Admitting: Internal Medicine

## 2017-06-27 ENCOUNTER — Encounter: Payer: Self-pay | Admitting: Internal Medicine

## 2017-06-27 ENCOUNTER — Ambulatory Visit: Payer: Medicaid Other | Admitting: Internal Medicine

## 2017-06-27 VITALS — BP 126/72 | HR 94 | Ht 63.5 in | Wt 164.0 lb

## 2017-06-27 DIAGNOSIS — J441 Chronic obstructive pulmonary disease with (acute) exacerbation: Secondary | ICD-10-CM | POA: Diagnosis not present

## 2017-06-27 DIAGNOSIS — F17210 Nicotine dependence, cigarettes, uncomplicated: Secondary | ICD-10-CM

## 2017-06-27 MED ORDER — BUDESONIDE-FORMOTEROL FUMARATE 80-4.5 MCG/ACT IN AERO: 2.0000 | INHALATION_SPRAY | Freq: Two times a day (BID) | RESPIRATORY_TRACT | 12 refills | Status: DC

## 2017-06-27 MED ORDER — ALBUTEROL SULFATE HFA 108 (90 BASE) MCG/ACT IN AERS: 2.0000 | INHALATION_SPRAY | Freq: Four times a day (QID) | RESPIRATORY_TRACT | 2 refills | Status: DC | PRN

## 2017-06-27 MED ORDER — BUDESONIDE-FORMOTEROL FUMARATE 80-4.5 MCG/ACT IN AERO: 2.0000 | INHALATION_SPRAY | Freq: Two times a day (BID) | RESPIRATORY_TRACT | 0 refills | Status: DC

## 2017-06-27 MED ORDER — PREDNISONE 10 MG PO TABS: ORAL_TABLET | ORAL | 0 refills | Status: DC

## 2017-06-27 NOTE — Progress Notes (Signed)
History of Present Illness Katherine [name] is a [age] yowf smoker  PMH of ETOH, tobacco use, hyperlipidemia and schizophrenia. She was seen during a hospital admission from 04/29/2016-05/07/2016 for acute hypoxic respiratory failure possibly due to pneumonia vs. Viral illness.    06/23/2016 NP note Hospital Follow Up: Pt. Presents for hospital follow up of acute respiratory failure secondary to pneumonia/ ? ILD / ? viral etiology.Marland Kitchen. She was treated with ABX, steroids, oxygen, scheduled BD's , prn BIPAP and lasix. She improved clinically and was discharged home on a prednisone taper and oxygen.She completed her pred taper today. She is not wheezing and states her dyspnea is better. She did not take  prednisone prior to this admission.She is not wearing her oxygen today, but states that she does use it at times. She describes an intermittent  anginal type pain that resolves with oxygen. She does take a statin for hyperlipidemia. 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 We had a long discussion regarding the reasons she needs to quit. We specifically discussed the risk of smoking while wearing oxygen. She verbalized understanding of the risk of explosion and flash burns.She denies fever, orthopnea or hemoptysis. She states she rarely coughs.Secretions are clear to light yellow.She states she is compliant with her symbicort and spiriva daily. She states she uses her rescue inhaler about once daily. We did discuss that she should consider getting a primary care physician.She told me she is seen on a regular basis by [facility].  She states she is feeling better. She weaning herself from her oxygen. She is continuing to smoke 1/2 PPD.She does use her oxygen occasionally for intermittent shortness of breath She has been weaning oxygen. Referral to cardiology for anginal type pain that is intermittent and self resolves with oxygen use. False left eye rec We  will walk you today in the office to see if you continue to need your oxygen Do not use oxygen while smoking. You need to quit smoking. We will schedule your follow up CT Chest without contrast, Follow up Pneumonia and suspected ILD. Referral to cardiology.( Intermittent anginal like pain) Schedule PFT's in 4 weeks  It is ok to stop prednisone. Continue your Symbicort and Spiriva daily without fail. Rinse mouth after use  Continue to use your rescue inhaler as needed for break through. 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 Follow-up    PFT's done today.  Breathing has improved, but not back at her normal baseline yet. She is using her albuterol inhaler 5 x per wk on average.   doe still MMRC3 = can't walk 100 yards even at a slow pace at a flat grade s stopping due to sob eg [employer] [facility] Some better p saba  rec The key is to stop smoking completely before smoking completely stops you!  Plan A = Automatic = stop spiriva and continue symbicort 80 Take 2 puffs first thing in am and then another 2 puffs about 12 hours later.  Work on inhaler technique:  Plan B = Backup Only use your albuterol as a rescue medication  Pease remember to go to the  x-ray department downstairs in the basement  for your tests -  we will call you with the results when they are available. Please schedule a follow up visit in 3 months but call sooner if needed  with all medications /inhalers/ solutions in hand so we can verify exactly what you are taking. 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 Follow-up    Breathing is doing better, back to baseline. She is  having some increased chest congestion and says her PCP advised her to use her albuterol inhaler 4 x daily to help with this. She has been coughing up some yellow sputum.    cough/ congestion worse in am/better on mucinex and more saba though hfa poor technique/ still smoking 1/4 ppd Mucus is sev tsp slt yellowish rec Plan A = Automatic = continue symbicort 80 Take 2 puffs first thing in am and then another 2 puffs about 12 hours later.  Work on inhaler technique:   Plan B = Backup Only use your albuterol as a rescue medication the key is to stop smoking completely before smoking completely stops you!       06/27/2017 acute extended ov/Katherine [name] re:    Aecopd/ still smoking  Chief Complaint  Patient presents with  . Acute Visit    SOB at all times, nonproductive cough, was dx: with pneumoina on 06/23/17   2 weeks prior to ov doing fine on symb 80 2bid and no rescue at all Then abruptly ill with cough/ sob > cxr dx pna > rx ? levaquin x 5 days/ pred also and added spiriva  Started with worse sob/ cough esp lying down. Congested cough on liquid mucinex, can't actually cough up any mucus Used up her saba and no worse since ran out - note poor baseline hfa   No obvious day to day or daytime variability or assoc ongoing  excess/ purulent sputum or mucus plugs or hemoptysis or cp or chest tightness, subjective wheeze or overt sinus or hb symptoms. No unusual exposure hx or h/o childhood pna/ asthma or knowledge of premature birth.     Also denies any obvious fluctuation of symptoms with weather or environmental changes or other aggravating or alleviating factors except as outlined above   Current Allergies, Complete Past Medical History, Past Surgical History, Family History, and Social History were reviewed in [employer] record.  ROS  The following are not active complaints unless bolded Hoarseness, sore throat, dysphagia, dental problems, itching, sneezing,  nasal  congestion or discharge of excess mucus or purulent secretions, ear ache,   fever, chills, sweats, unintended wt loss or wt gain, classically pleuritic or exertional cp,  orthopnea pnd or leg swelling, presyncope, palpitations, abdominal pain, anorexia, nausea, vomiting, diarrhea  or change in bowel habits or change in bladder habits, change in stools or change in urine, dysuria, hematuria,  rash, arthralgias, visual complaints, headache, numbness, weakness or ataxia or problems with walking or coordination,  change in mood/affect or memory.        Current Meds  Medication Sig  . acetaminophen (TYLENOL) 325 MG tablet Take 650 mg by mouth every 6 (six) hours as needed for moderate pain or headache.  . ARIPiprazole (ABILIFY) 5 MG tablet Take 5 mg by mouth daily.  Marland Kitchen gabapentin (NEURONTIN) 300 MG capsule Take 300 mg by mouth 3 (three) times daily.  . sertraline (  ZOLOFT) 25 MG tablet Take 25 mg by mouth daily.  . simvastatin (ZOCOR) 20 MG tablet Take 20 mg by mouth daily.  . SYMBICORT 80-4.5 MCG/ACT inhaler INHALE 2 PUFFS BY MOUTH TWICE DAILY.  . [  guaiFENesin (MUCINEX) 600 MG 12 hr tablet Take 1,200 mg by mouth 2 (two) times daily.  . [  SPIRIVA HANDIHALER 18 MCG inhalation capsule INHALE CONTENTS OF 1 CAPSULE DAILY.                       STUDIES:  Echo 1/26 > LVEF 55%- 60%, normal wall motion       Physical Exam:  amb wf nad    06/27/2017        164   09/24/16 146 lb (66.2 kg)  07/14/16 153 lb (69.4 kg)  07/08/16 153 lb 6.4 oz (69.6 kg)      Vital signs reviewed - Note on arrival 02 sats  94% on RA     HEENT: nl dentition  and oropharynx. Nl external ear canals without cough reflex - moderate bilateral non-specific turbinate edema    NECK :  without JVD/Nodes/TM/ nl carotid upstrokes bilaterally   LUNGS: no acc muscle use,  Nl contour chest with insp pops/squeaks on insp L > R  Base and min  Exp rhonchi    CV:  RRR  no s3 or murmur or increase in P2, and no edema     ABD:  soft and nontender with nl inspiratory excursion in the supine position. No bruits or organomegaly appreciated, bowel sounds nl  MS:  Nl gait/ ext warm without deformities, calf tenderness, cyanosis or clubbing No obvious joint restrictions   SKIN: warm and dry without lesions    NEURO:  alert, approp, nl sensorium with  no motor or cerebellar deficits apparent.       CXR PA and Lateral:   06/27/2017 :    I personally reviewed images and agree with radiology impression as follows:   Stable interstitial thickening. No frank edema or consolidation. Stable cardiac silhouette.

## 2017-06-27 NOTE — Patient Instructions (Addendum)
Try prilosec otc (over there counter)  20mg   Take 30-60 min before first meal of the day and Pepcid ac - over the counter -  (famotidine) 20 mg one @  bedtime until cough is completely gone for at least a week without the need for cough suppression   Work on inhaler technique:  relax and gently blow all the way out then take a nice smooth deep breath back in, triggering the inhaler at same time you start breathing in.  Hold for up to 5 seconds if you can. Blow out thru nose. Rinse and gargle with water when done     Stop spiriva  Prednisone 10 mg take  4 each am x 2 days,   2 each am x 2 days,  1 each am x 2 days and stop   Please remember to go to the  x-ray department downstairs in the basement  for your tests - we will call you with the results when they are available.   The key is to stop smoking completely before smoking completely stops you!   If not 100% back to your usual self in 2 weeks then followup is here, otherwise return as needed

## 2017-06-28 NOTE — Progress Notes (Signed)
Spoke with pt and notified of results per Dr. [name]. Pt verbalized understanding and denied any questions. 

## 2017-06-29 ENCOUNTER — Encounter: Payer: Self-pay | Admitting: Internal Medicine

## 2017-06-29 NOTE — Assessment & Plan Note (Addendum)
09/24/2016  d/c spiriva/ continue symb 80 - PFT's  09/24/2016  FEV1 1.94 (67 % ) ratio 87  p no % improvement from saba p nothing prior to study with DLCO  40/38 % corrects to 55  % for alv volume     06/27/2017  After extensive coaching inhaler device  effectiveness =    75% (ti too short)     DDX of  difficult airways management almost all start with A and  include Adherence, Ace Inhibitors, Acid Reflux, Active Sinus Disease, Alpha 1 Antitripsin deficiency, Anxiety masquerading as Airways dz,  ABPA,  Allergy(esp in young), Aspiration (esp in elderly), Adverse effects of meds,  Active smokers, A bunch of PE's (a small clot burden can't cause this syndrome unless there is already severe underlying pulm or vascular dz with poor reserve) plus two Bs  = Bronchiectasis and Beta blocker use..and one C= CHF   Adherence is always the initial "prime suspect" and is a multilayered concern that requires a "trust but verify" approach in every patient - starting with knowing how to use medications, especially inhalers, correctly, keeping up with refills and understanding the fundamental difference between maintenance and prns vs those medications only taken for a very short course and then stopped and not refilled.  - see hfa teaching  - return with all meds in hand using a trust but verify approach to confirm accurate Medication  Reconciliation The principal here is that until we are certain that the  patients are doing what we've asked, it makes no sense to ask them to do more.    Active smoking top of the usual list of suspects  ? Allergy/ asthmatic component >  Prednisone 10 mg take  4 each am x 2 days,   2 each am x 2 days,  1 each am x 2 days and stop    ? Acid (or non-acid) GERD > always difficult to exclude as up to 75% of pts in some series report no assoc GI/ Heartburn symptoms> rec max (24h)  acid suppression and diet restrictions/ reviewed and instructions given in writing.  - Of the three most  common causes of  Sub-acute or recurrent or chronic cough, only one (GERD)  can actually contribute to/ trigger  the other two (asthma and post nasal drip syndrome)  and perpetuate the cylce of cough.  ? Adverse effects of meds, esp DPI > try off spiriva dpi since only GOLD0 anyway   While not intuitively obvious, many patients with chronic low grade reflux do not cough until there is a primary insult that disturbs the protective epithelial barrier and exposes sensitive nerve endings.   This is typically viral but can be direct physical injury such as with an endotracheal tube.   The point is that once this occurs, it is difficult to eliminate the cycle  using anything but a maximally effective acid suppression regimen at least in the short run, accompanied by an appropriate diet to address non acid GERD     ? Anxiety > usually at the bottom of this list of usual suspects but should be   higher on this pt's based on H and P and note already on psychotropics - may interfere with ability to follow instructions and stop smoking    I had an extended discussion with the patient reviewing all relevant studies completed to date and  lasting 25 minutes of a 40  minute acute office visit addressing  severe non-specific but potentially very  serious refractory respiratory symptoms of uncertain and potentially multiple  etiologies.   Each maintenance medication was reviewed in detail including most importantly the difference between maintenance and prns and under what circumstances the prns are to be triggered using an action plan format that is not reflected in the computer generated alphabetically organized AVS.    Please see AVS for specific instructions unique to this office visit that I personally wrote and verbalized to the the pt in detail and then reviewed with pt  by my nurse highlighting any changes in therapy/plan of care  recommended at today's visit.

## 2017-07-12 ENCOUNTER — Telehealth: Payer: Self-pay | Admitting: Internal Medicine

## 2017-07-12 NOTE — Telephone Encounter (Signed)
Saw phone note from 3/19 where pt's ct was denied coverage from insurance due to pt needing an OV with a cxr before ct scan could be approved.  Pt came in to the office 3//25/19 and had the OV followed by a cxr.  [facility] [place] [facility] who stated to me the ct was cancelled on 06/27/17 by [facility]Libby[facility].  Misty StanleyStacey stated to me the scan has not had authorization done nor has been precerted.  Called pt stating this information to her the information that I found out why the CT was cancelled.  Stated to pt when we could reschedule the CT we would call her and let her know.  Going to route this message to [facility] for her to follow up on to see if she is able to see if pt can go ahead and have the CT done, if insurance will be able to approve this to be done for pt.

## 2017-07-13 ENCOUNTER — Inpatient Hospital Stay: Admission: RE | Admit: 2017-07-13 | Payer: Medicaid Other | Source: Ambulatory Visit

## 2017-07-13 NOTE — Telephone Encounter (Signed)
Huntley DecSara had the info to do a peer to peer and it never got done I will try again to precert Tobe SosSally E Ottinger

## 2017-07-20 NOTE — Telephone Encounter (Signed)
[facility] this to [facility], please advise if this has been taken care of, thank you.

## 2017-07-20 NOTE — Telephone Encounter (Signed)
She is scheduled for 07/28/17#2pm Berkley Harveyauth #[id number] pt is aware Tobe SosSally E Ottinger

## 2017-07-28 ENCOUNTER — Other Ambulatory Visit: Payer: Medicaid Other

## 2017-08-10 ENCOUNTER — Ambulatory Visit (INDEPENDENT_AMBULATORY_CARE_PROVIDER_SITE_OTHER)
Admission: RE | Admit: 2017-08-10 | Discharge: 2017-08-10 | Disposition: A | Discharge status: Home / Self Care | Payer: Medicaid Other | Source: Ambulatory Visit | Attending: Acute Care | Admitting: Acute Care

## 2017-08-10 DIAGNOSIS — J441 Chronic obstructive pulmonary disease with (acute) exacerbation: Secondary | ICD-10-CM

## 2017-09-16 ENCOUNTER — Telehealth: Payer: Self-pay | Admitting: *Deleted

## 2017-09-16 DIAGNOSIS — J849 Interstitial pulmonary disease, unspecified: Secondary | ICD-10-CM

## 2017-09-16 NOTE — Telephone Encounter (Signed)
-----   Message from Bevelyn NgoSarah F Groce, NP sent at 08/16/2017  5:55 PM EDT ----- Regarding: FW: ILD changes on CT. Please call patient and let her know we want to do a follow up scan that will look more closely at some of the changes noted in her lungs.  Please order HRCT, supine and prone to be read by [name]. Schedule with Irving BurtonEmily for ILD clinic with Dr. Marchelle Gearingamaswamy at first available appointment. Thanks so much ----- Message ----- From: Nyoka CowdenWert, Michael B, MD Sent: 08/16/2017   5:38 PM To: Bevelyn NgoSarah F Groce, NP Subject: RE: ILD changes on CT.                         Fine with me ----- Message ----- From: Bevelyn NgoGroce, Sarah F, NP Sent: 08/16/2017   5:25 PM To: Nyoka CowdenMichael B [name], MD Subject: ILD changes on CT.                             Dr. Sherene SiresWert. The latest Ct was sent to me as I ordered it 1 year ago. There are subtle changes of ILD. Is it ok to order HRCT and send to Dr. Marchelle Gearingamaswamy for ILD clinic.Thanks

## 2017-09-16 NOTE — Telephone Encounter (Signed)
Forwarding to Delaney Meigsamara to f/u on

## 2017-09-19 ENCOUNTER — Other Ambulatory Visit: Payer: Self-pay

## 2017-09-19 NOTE — Telephone Encounter (Signed)
I tried calling pt but her phone was not in service. Also tried her emergency contact and the number rang busy X #. I will try to keep contacting pt and I sent a letter to her address to advise her to call.

## 2017-09-19 NOTE — Progress Notes (Unsigned)
Sent unable to reach letter to her home address. Unable to contact pt or emergency contact. TA/CMA

## 2017-09-21 NOTE — Telephone Encounter (Signed)
Attempted to call x 1, contact number remains disconnected and emergency contact line continues to be busy.

## 2017-09-26 NOTE — Telephone Encounter (Signed)
Called and spoke with patient regarding PFT and ILD appt. Schedule PFT 10:30 am and OV following at 11am on 11/09/2017 Nothing further needed at this time.

## 2017-09-26 NOTE — Telephone Encounter (Signed)
Attempted to call patient today regarding follow up HRCT scan, supine and prone to be read by [name]. Also an appt needs scheduled for ILD clinic with MR Tues or Thurs afternoon in 30min slot. Pt prefers to be contacted at phone [phone] I did not receive an answer at time of call. I have left a voicemail message for pt to return call. X1  Routing to [place]ara to follow up on and review.

## 2017-09-26 NOTE — Telephone Encounter (Signed)
Patient returned call, states she has been having phone problems and please call her at [phone].

## 2017-09-26 NOTE — Telephone Encounter (Signed)
Patient returned call, CB is [phone]

## 2017-09-26 NOTE — Telephone Encounter (Signed)
Patient returning call, CB is [phone].

## 2017-09-26 NOTE — Telephone Encounter (Signed)
She already had HRCT May 2019. So no need for anther CT.  She also had autoimune in 2018 - so no need for that But she needs Pre-bd spiro and dlco only. No lung volume or bd response. No post-bd spiro - last PFT June 2018  So she needs to do PFT and see me - she can see me in 30 min slot first available or ILD clinic - which ever is first available  Thanks  Dr. Kalman ShanMurali Skarlette Lattner, M.D., [facility]P Pulmonary and Critical Care Medicine Staff Physician, [facility] Center Director - Interstitial Lung Disease  Program  Pulmonary Fibrosis [facility] at [facility] [place], [place], [id number]  Pager: [phone], If no answer or between  15:00h - 7:00h: call [phone] Telephone: [phone]

## 2017-09-26 NOTE — Telephone Encounter (Signed)
MR, you do not have any ILD openings but you have plenty of regular clinic openings in August.   Please advise if you are ok with her waiting until then or you would wish for her to have the CT scan first, review it and then discuss on an OV. Thanks!

## 2017-09-26 NOTE — Telephone Encounter (Signed)
Attempted to call patient today regarding MR recommendations below. I did not receive an answer at time of call. I have left a voicemail message for pt to return call. X1  Pt needs to schedule a 30min PFT and 30min appt with MR first available.   Routing message to Irving Burtonmily to follow up with at later date.

## 2017-11-01 ENCOUNTER — Telehealth: Payer: Self-pay

## 2017-11-01 NOTE — Telephone Encounter (Signed)
Called pt but her number is disconnected. I called her emergency contact and the number rang busy X 3. Will try again but will send a letter if I am unable to reach patient.

## 2017-11-01 NOTE — Telephone Encounter (Signed)
-----   Message from Bevelyn NgoSarah F Groce, NP sent at 08/16/2017  5:55 PM EDT ----- Regarding: FW: ILD changes on CT. Please call patient and let her know we want to do a follow up scan that will look more closely at some of the changes noted in her lungs.  Please order HRCT, supine and prone to be read by [name]. Schedule with Irving BurtonEmily for ILD clinic with Dr. Marchelle Gearingamaswamy at first available appointment. Thanks so much ----- Message ----- From: Nyoka CowdenWert, Michael B, MD Sent: 08/16/2017   5:38 PM To: Bevelyn NgoSarah F Groce, NP Subject: RE: ILD changes on CT.                         Fine with me ----- Message ----- From: Bevelyn NgoGroce, Sarah F, NP Sent: 08/16/2017   5:25 PM To: Nyoka CowdenMichael B [name], MD Subject: ILD changes on CT.                             Dr. Sherene SiresWert. The latest Ct was sent to me as I ordered it 1 year ago. There are subtle changes of ILD. Is it ok to order HRCT and send to Dr. Marchelle Gearingamaswamy for ILD clinic.Thanks

## 2017-11-09 ENCOUNTER — Ambulatory Visit: Payer: Medicaid Other | Admitting: Internal Medicine

## 2017-11-09 ENCOUNTER — Other Ambulatory Visit: Payer: Self-pay | Admitting: Internal Medicine

## 2017-11-09 DIAGNOSIS — J849 Interstitial pulmonary disease, unspecified: Secondary | ICD-10-CM

## 2017-12-01 ENCOUNTER — Ambulatory Visit: Payer: Medicaid Other | Admitting: Internal Medicine

## 2017-12-01 DIAGNOSIS — J849 Interstitial pulmonary disease, unspecified: Secondary | ICD-10-CM

## 2017-12-01 LAB — PULMONARY FUNCTION TEST
DL/VA % PRED: 54 %
DL/VA: 2.61 ml/min/mmHg/L
DLCO unc % pred: 37 %
DLCO unc: 8.96 ml/min/mmHg
FEF 25-75 PRE: 2.35 L/s
FEF2575-%Pred-Pre: 82 %
FEV1-%PRED-PRE: 68 %
FEV1-PRE: 1.93 L
FEV1FVC-%Pred-Pre: 105 %
FEV6-%Pred-Pre: 64 %
FEV6-PRE: 2.25 L
FEV6FVC-%Pred-Pre: 101 %
FVC-%Pred-Pre: 63 %
FVC-PRE: 2.26 L
PRE FEV1/FVC RATIO: 85 %
Pre FEV6/FVC Ratio: 99 %

## 2017-12-01 NOTE — Progress Notes (Signed)
PFT done today. 

## 2017-12-21 ENCOUNTER — Other Ambulatory Visit (INDEPENDENT_AMBULATORY_CARE_PROVIDER_SITE_OTHER): Payer: Medicaid Other

## 2017-12-21 ENCOUNTER — Ambulatory Visit: Payer: Medicaid Other | Admitting: Internal Medicine

## 2017-12-21 ENCOUNTER — Encounter: Payer: Self-pay | Admitting: Pulmonary Disease

## 2017-12-21 ENCOUNTER — Ambulatory Visit (INDEPENDENT_AMBULATORY_CARE_PROVIDER_SITE_OTHER): Payer: Medicaid Other | Admitting: Pulmonary Disease

## 2017-12-21 VITALS — BP 126/68 | HR 82 | Ht 63.5 in | Wt 167.0 lb

## 2017-12-21 DIAGNOSIS — Z23 Encounter for immunization: Secondary | ICD-10-CM

## 2017-12-21 DIAGNOSIS — J449 Chronic obstructive pulmonary disease, unspecified: Secondary | ICD-10-CM | POA: Diagnosis not present

## 2017-12-21 DIAGNOSIS — F17210 Nicotine dependence, cigarettes, uncomplicated: Secondary | ICD-10-CM | POA: Diagnosis not present

## 2017-12-21 DIAGNOSIS — J0100 Acute maxillary sinusitis, unspecified: Secondary | ICD-10-CM | POA: Insufficient documentation

## 2017-12-21 DIAGNOSIS — J849 Interstitial pulmonary disease, unspecified: Secondary | ICD-10-CM

## 2017-12-21 DIAGNOSIS — R918 Other nonspecific abnormal finding of lung field: Secondary | ICD-10-CM

## 2017-12-21 LAB — SEDIMENTATION RATE: SED RATE: 5 mm/h (ref 0–20)

## 2017-12-21 MED ORDER — FLUTICASONE-UMECLIDIN-VILANT 100-62.5-25 MCG/INH IN AEPB: 1.0000 | INHALATION_SPRAY | Freq: Every day | RESPIRATORY_TRACT | 0 refills | Status: DC

## 2017-12-21 MED ORDER — BUDESONIDE-FORMOTEROL FUMARATE 80-4.5 MCG/ACT IN AERO: 2.0000 | INHALATION_SPRAY | Freq: Two times a day (BID) | RESPIRATORY_TRACT | 0 refills | Status: DC

## 2017-12-21 MED ORDER — AMOXICILLIN-POT CLAVULANATE 875-125 MG PO TABS: 1.0000 | ORAL_TABLET | Freq: Two times a day (BID) | ORAL | 0 refills | Status: DC

## 2017-12-21 NOTE — Progress Notes (Unsigned)
SIGNIFICANT EVENTS: 1/25  Presents to OSH with severe respiratory distress  1/26 1L IV fluids for mild hypotension overnight > tachypnea.  Then lasix + BiPAP with improvement.   06/23/2016  Hospital Follow Up:  Katherine Katherine [name] is Katherine [name] 47 y.o. female with PMH of ETOH, tobacco use, hyperlipidemia and schizophrenia. She was seen during Katherine [name] hospital admission from 04/29/2016-05/07/2016 for acute hypoxic respiratory failure possibly due to pneumonia vs. Viral illness.She presents for hospital follow up.     Pt. Presents for hospital follow up of acute respiratory failure secondary to pneumonia/ ? ILD / ? viral etiology.Marland Kitchen She was treated with ABX, steroids, oxygen, scheduled BD's , prn BIPAP and lasix. She improved clinically and was discharged home on Katherine [name] prednisone taper and oxygen.She completed her pred taper today. She is not wheezing and states her dyspnea is better. She did not take  prednisone prior to this admission.She is not wearing her oxygen today, but states that she does use it at times. She describes an intermittent  anginal type pain that resolves with oxygen. She does take Katherine [name] statin for hyperlipidemia. 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 We had Katherine [name] long discussion regarding the reasons she needs to quit. We specifically discussed the risk of smoking while wearing oxygen. She verbalized understanding of the risk of explosion and flash burns.She denies fever, orthopnea or hemoptysis. She states she rarely coughs.Secretions are clear to light yellow.She states she is compliant with her symbicort and spiriva daily. She states she uses her rescue inhaler about once daily. We did discuss that she should consider getting Katherine [name] primary care physician.She told me she is seen on Katherine [name] regular basis by [facility].  She states she is feeling better. She weaning herself from her oxygen. She is continuing to smoke 1/2 PPD.She does use her oxygen occasionally  for intermittent shortness of breath She has been weaning oxygen. Referral to cardiology for anginal type pain that is intermittent and self resolves with oxygen use. False left eye  Tests STUDIES:  Echo 1/26 > LVEF 55%- 60%, normal wall motion CT chest 1/28 > Diffuse bilateral interstitial opacity with geographic ground-glass opacities throughout the lungs favoring the lung bases, and some confluent dependent airspace opacities in the lower lobes. Secondary pulmonary lobular thickening.   -Autoimmune workup: ANA negative, ANCA WNL, CCP WNL, RF 28.3 elevated -ESR and CRP elevated   06/27/2017 acute extended ov/[name] re:    Aecopd/ still smoking  Chief Complaint  Patient presents with  . Acute Visit    SOB at all times, nonproductive cough, was dx: with pneumoina on 06/23/17   2 weeks prior to ov doing fine on symb 80 2bid and no rescue at all Then abruptly ill with cough/ sob > cxr dx pna > rx ? levaquin x 5 days/ pred also and added spiriva  Started with worse sob/ cough esp lying down. Congested cough on liquid mucinex, can't actually cough up any mucus Used up her saba and no worse since ran out - note poor baseline hfa   No obvious day to day or daytime variability or assoc ongoing  excess/ purulent sputum or mucus plugs or hemoptysis or cp or chest tightness, subjective wheeze or overt sinus or hb symptoms. No unusual exposure hx or h/o childhood pna/ asthma or knowledge of premature birth.     06/23/2016 NP note Hospital Follow Up: Pt. Presents for hospital follow up of acute respiratory failure secondary  to pneumonia/ ? ILD / ? viral etiology.Marland Kitchen She was treated with ABX, steroids, oxygen, scheduled BD's , prn BIPAP and lasix. She improved clinically and was discharged home on Katherine [name] prednisone taper and oxygen.She completed her pred taper today. She is not wheezing and states her dyspnea is better. She did not take  prednisone prior to this admission.She is not wearing her oxygen today,  but states that she does use it at times. She describes an intermittent  anginal type pain that resolves with oxygen. She does take Katherine [name] statin for hyperlipidemia. 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 We had Katherine [name] long discussion regarding the reasons she needs to quit. We specifically discussed the risk of smoking while wearing oxygen. She verbalized understanding of the risk of explosion and flash burns.She denies fever, orthopnea or hemoptysis. She states she rarely coughs.Secretions are clear to light yellow.She states she is compliant with her symbicort and spiriva daily. She states she uses her rescue inhaler about once daily. We did discuss that she should consider getting Katherine [name] primary care physician.She told me she is seen on Katherine [name] regular basis by [name].  She states she is feeling better. She weaning herself from her oxygen. She is continuing to smoke 1/2 PPD.She does use her oxygen occasionally for intermittent shortness of breath She has been weaning oxygen. Referral to cardiology for anginal type pain that is intermittent and self resolves with oxygen use. False left eye rec It is nice to meet you today. We will walk you today in the office to see if you continue to need your oxygen Do not use oxygen while smoking. You need to quit smoking. We will schedule your follow up CT Chest without contrast, Follow up Pneumonia and suspected ILD. Referral to cardiology.( Intermittent anginal like pain) Schedule PFT's in 4 weeks, then follow up with Dr. Corrie Dandy same day. It is ok to stop prednisone. Continue your Symbicort and Spiriva daily without fail. Rinse mouth after use  Continue to use your rescue inhaler as needed for break through. Please contact office for sooner follow up if symptoms do not improve or worsen or seek emergency care     09/24/2016  f/u ov/[name] re:  COPD 0 / symb 80 2bid/spiriva and still smoking  Chief Complaint    Patient presents with  . Follow-up    PFT's done today.  Breathing has improved, but not back at her normal baseline yet. She is using her albuterol inhaler 5 x per wk on average.   doe still MMRC3 = can't walk 100 yards even at Katherine [name] slow pace at Katherine [name] flat grade s stopping due to sob eg [employer] walking / does ok at [facility] ? Some better p saba   No obvious day to day or daytime variability or assoc excess/ purulent sputum or mucus plugs or hemoptysis or cp or chest tightness, subjective wheeze or overt sinus or hb symptoms. No unusual exp hx or h/o childhood pna/ asthma or knowledge of premature birth.  Sleeping ok without nocturnal  or early am exacerbation  of respiratory  c/o's or need for noct saba. Also denies any obvious fluctuation of symptoms with weather or environmental changes or other aggravating or alleviating factors except as outlined above      OV 12/21/2017  Subjective:  Patient ID: Katherine Katherine [name], female , DOB: 01-05-1971 , age 74 y.o. , MRN: [id number] , ADDRESS: [address] [place]  [address]   12/21/2017 -  No chief complaint on file.    HPI Katherine Katherine [name] 47 y.o. -     PFT Results for Katherine Katherine [name], Katherine Katherine [name] (MRN [id number]) as of 12/21/2017 09:05  Ref. Range 09/24/2016 14:38 12/01/2017 09:36  FVC-Pre Latest Units: L 2.33 2.26  FVC-%Pred-Pre Latest Units: % 64 63   Results for Katherine Katherine [name], Katherine Katherine [name] (MRN [id number]) as of 12/21/2017 09:05  Ref. Range 09/24/2016 14:38 12/01/2017 09:36  DLCO unc Latest Units: ml/min/mmHg 9.74 8.96  DLCO unc % pred Latest Units: % 40 37    CT CHEST HIGH RES - May 2019 IMPRESSION: 1. Very subtle changes in the lung bases which could be indicative of interstitial lung disease. 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 Repeat high-resolution chest CT is suggested in 12 months to assess for temporal changes in the lung parenchyma. 2. Mild diffuse  bronchial wall thickening with mild centrilobular and paraseptal emphysema; imaging findings compatible with the reported clinical history of COPD. 3. Aortic atherosclerosis, in addition to left main and 3 vessel coronary artery disease. Please note that although the presence of coronary artery calcium documents the presence of coronary artery disease, the severity of this disease and any potential stenosis cannot be assessed on this non-gated CT examination. Assessment for potential risk factor modification, dietary therapy or pharmacologic therapy may be warranted, if clinically indicated.  Aortic Atherosclerosis (ICD10-I70.0) and Emphysema (ICD10-J43.9).   Electronically Signed   By: Vinnie Langton M.D.   On: 08/11/2017 10:36   Results for Katherine Katherine [name], Katherine Katherine [name] (MRN [id number]) as of 12/21/2017 09:05  Ref. Range 05/03/2016 02:03  Anti Nuclear Antibody(ANA) Latest Ref Range: Negative  Negative  ANCA Proteinase 3 Latest Ref Range: 0.0 - 3.5 U/mL <3.5  CCP Antibodies IgG/IgA Latest Ref Range: 0 - 19 units 10  Myeloperoxidase Abs Latest Ref Range: 0.0 - 9.0 U/mL <9.0  RA Latex Turbid. Latest Ref Range: 0.0 - 13.9 IU/mL 28.3 (H)  Cytoplasmic (C-ANCA) Latest Ref Range: Neg:<1:20 titer <1:20  P-ANCA Latest Ref Range: Neg:<1:20 titer <1:20  Atypical P-ANCA titer Latest Ref Range: Neg:<1:20 titer <1:20    ROS - per HPI     has Katherine [name] past medical history of Angina at [facility]), COPD (chronic obstructive pulmonary disease) ([place]), ETOH abuse, Hyperlipidemia, Hypoxia, Schizophrenia ([place]), and Tobacco use.   reports that she has been smoking cigarettes. She has Katherine [name] 7.50 pack-year smoking history. She has never used smokeless tobacco.  No past surgical history on file.  Allergies  Allergen Reactions  . Doxycycline Swelling    Immunization History  Administered Date(s) Administered  . Influenza,inj,Quad PF,6+ Mos 01/18/2017  . Pneumococcal Polysaccharide-23 04/30/2016    Family  History  Problem Relation Age of Onset  . Diabetes Mother   . Lung cancer Father   . Hypertension Father      Current Outpatient Medications:  .  acetaminophen (TYLENOL) 325 MG tablet, Take 650 mg by mouth every 6 (six) hours as needed for moderate pain or headache., Disp: , Rfl:  .  albuterol (PROVENTIL HFA;VENTOLIN HFA) 108 (90 Base) MCG/ACT inhaler, Inhale 2 puffs into the lungs every 6 (six) hours as needed for wheezing or shortness of breath., Disp: 1 Inhaler, Rfl: 2 .  ARIPiprazole (ABILIFY) 5 MG tablet, Take 5 mg by mouth daily., Disp: , Rfl:  .  budesonide-formoterol (SYMBICORT) 80-4.5 MCG/ACT inhaler, Inhale 2 puffs into the lungs 2 (two) times daily., Disp: 1  Inhaler, Rfl: 12 .  cetirizine (ZYRTEC) 10 MG tablet, Take 10 mg by mouth daily., Disp: , Rfl:  .  fluticasone (FLONASE) 50 MCG/ACT nasal spray, Place 2 sprays into both nostrils daily., Disp: , Rfl:  .  gabapentin (NEURONTIN) 300 MG capsule, Take 300 mg by mouth 3 (three) times daily., Disp: , Rfl:  .  predniSONE (DELTASONE) 10 MG tablet, Take  4 each am x 2 days,   2 each am x 2 days,  1 each am x 2 days and stop, Disp: 14 tablet, Rfl: 0 .  sertraline (ZOLOFT) 25 MG tablet, Take 25 mg by mouth daily., Disp: , Rfl:  .  simvastatin (ZOCOR) 20 MG tablet, Take 20 mg by mouth daily., Disp: , Rfl:       Objective:   There were no vitals filed for this visit.  Estimated body mass index is 28.6 kg/m as calculated from the following:   Height as of 06/27/17: 5' 3.5" (1.613 m).   Weight as of 06/27/17: 164 lb (74.4 kg).  @WEIGHTCHANGE @  There were no vitals filed for this visit.   Physical Exam  General Appearance:    Alert, cooperative, no distress, appears stated age - *** , sitting on - ***  Head:    Normocephalic, without obvious abnormality, atraumatic  Eyes:    PERRL, conjunctiva/corneas clear,  Ears:    Normal TM's and external ear canals, both ears  Nose:   Nares normal, septum midline, mucosa normal, no drainage     or sinus tenderness. OXYGEN ON  - *** . Patient is @ ***   Throat:   Lips, mucosa, and tongue normal; teeth and gums normal. Cyanosis on lips - ***  Neck:   Supple, symmetrical, trachea midline, no adenopathy;    thyroid:  no enlargement/tenderness/nodules; no carotid   bruit or JVD  Back:     Symmetric, no curvature, ROM normal, no CVA tenderness  Lungs:     Distress - *** , Wheeze ***, Barrell Chest - ***, Purse lip breathing - ***, Crackles - ***   Chest Wall:    No tenderness or deformity. Scars in chest ***   Heart:    Regular rate and rhythm, S1 and S2 normal, no rub   or gallop, Murmur - ***  Breast Exam:    NOT DONE  Abdomen:     Soft, non-tender, bowel sounds active all four quadrants,    no masses, no organomegaly  Genitalia:   NOT DONE  Rectal:   NOT DONE  Extremities:   Extremities normal, atraumatic, Clubbing - ***, Edema - ***  Pulses:   2+ and symmetric all extremities  Skin:   Stigmata of Connective Tissue Disease - ***  Lymph nodes:   Cervical, supraclavicular, and axillary nodes normal  Psychiatric:  Neurologic:   *** CNII-XII intact, normal strength, sensation  throughout           Assessment:     No diagnosis found.     Plan:     ***          SIGNATURE    Dr. Brand Males, M.D., F.C.C.P,  Pulmonary and Critical Care Medicine Staff Physician, [place] Director - Interstitial Lung Disease  Program  Pulmonary [place] [facility] [place], [place], [address]  Pager: [phone], If no answer or between  15:00h - 7:00h: call [phone] Telephone: [phone]  9:05 AM 12/21/2017

## 2017-12-21 NOTE — Patient Instructions (Addendum)
Labwork today   Augmentin >>> Take 1 875-125 mg tablet every 12 hours for the next 7 days >>> Take with food  Walk in office today   6 min walk at next appointment in 2 months with Dr. Marchelle Gearing  Regular flu vaccine today   Continue Symbicort 80 >>> 2 puffs in the morning right when you wake up, rinse out your mouth after use, 12 hours later 2 puffs, rinse after use >>> Take this daily, no matter what >>> This is not a rescue inhaler   May/2020 we will repeat a high-res CT of your lungs   Follow up with Dr. Marchelle Gearing in [facility] in 2 months  >>>13min visit   Follow up with Dr Sherene Sires as needed and in 6months   We recommend that you stop smoking.   Please contact your primary care regarding starting Chantix or Wellbutrin.  Discussed with them your intentions to stop smoking.  Smoking Cessation Resources:  1 800 QUIT NOW  >>> Patient to call this resource and utilize it to help support her quit smoking >>> Keep up your hard work with stopping smoking  You can also contact the [facility] >>>For smoking cessation classes call [phone]  We do not recommend using e-cigarettes as a form of stopping smoking   It is flu season:   >>>Remember to be washing your hands regularly, using hand sanitizer, be careful to use around herself with has contact with people who are sick will increase her chances of getting sick yourself. >>> Best ways to protect herself from the flu: Receive the yearly flu vaccine, practice good hand hygiene washing with soap and also using hand sanitizer when available, eat a nutritious meals, get adequate rest, hydrate appropriately   Please contact the office if your symptoms worsen or you have concerns that you are not improving.   Thank you for choosing [facility] for your healthcare, and for allowing [name] to partner with you on your healthcare journey. I am thankful to be able to provide care to you today.   [name]  FNP-C       Sinusitis, Adult Sinusitis is soreness and inflammation of your sinuses. Sinuses are hollow spaces in the bones around your face. They are located:  Around your eyes.  In the middle of your forehead.  Behind your nose.  In your cheekbones.  Your sinuses and nasal passages are lined with a stringy fluid (mucus). Mucus normally drains out of your sinuses. When your nasal tissues get inflamed or swollen, the mucus can get trapped or blocked so air cannot flow through your sinuses. This lets bacteria, viruses, and funguses grow, and that leads to infection. Follow these instructions at home: Medicines  Take, use, or apply over-the-counter and prescription medicines only as told by your doctor. These may include nasal sprays.  If you were prescribed an antibiotic medicine, take it as told by your doctor. Do not stop taking the antibiotic even if you start to feel better. Hydrate and Humidify  Drink enough water to keep your pee (urine) clear or pale yellow.  Use a cool mist humidifier to keep the humidity level in your home above 50%.  Breathe in steam for 10-15 minutes, 3-4 times a day or as told by your doctor. You can do this in the bathroom while a hot shower is running.  Try not to spend time in cool or dry air. Rest  Rest as much as possible.  Sleep with  your head raised (elevated).  Make sure to get enough sleep each night. General instructions  Put a warm, moist washcloth on your face 3-4 times a day or as told by your doctor. This will help with discomfort.  Wash your hands often with soap and water. If there is no soap and water, use hand sanitizer.  Do not smoke. Avoid being around people who are smoking (secondhand smoke).  Keep all follow-up visits as told by your doctor. This is important. Contact a doctor if:  You have a fever.  Your symptoms get worse.  Your symptoms do not get better within 10 days. Get help right away if:  You have a  very bad headache.  You cannot stop throwing up (vomiting).  You have pain or swelling around your face or eyes.  You have trouble seeing.  You feel confused.  Your neck is stiff.  You have trouble breathing. This information is not intended to replace advice given to you by your health care provider. Make sure you discuss any questions you have with your health care provider. Document Released: 09/08/2007 Document Revised: 11/16/2015 Document Reviewed: 01/15/2015 Elsevier Interactive Patient Education  2018 [employer].       Coping with Quitting Smoking Quitting smoking is a physical and mental challenge. You will face cravings, withdrawal symptoms, and temptation. Before quitting, work with your health care provider to make a plan that can help you cope. Preparation can help you quit and keep you from giving in. How can I cope with cravings? Cravings usually last for 5-10 minutes. If you get through it, the craving will pass. Consider taking the following actions to help you cope with cravings:  Keep your mouth busy: ? Chew sugar-free gum. ? Suck on hard candies or a straw. ? Brush your teeth.  Keep your hands and body busy: ? Immediately change to a different activity when you feel a craving. ? Squeeze or play with a ball. ? Do an activity or a hobby, like making bead jewelry, practicing needlepoint, or working with wood. ? Mix up your normal routine. ? Take a short exercise break. Go for a quick walk or run up and down stairs. ? Spend time in public places where smoking is not allowed.  Focus on doing something kind or helpful for someone else.  Call a friend or family member to talk during a craving.  Join a support group.  Call a quit line, such as 1-800-QUIT-NOW.  Talk with your health care provider about medicines that might help you cope with cravings and make quitting easier for you.  How can I deal with withdrawal symptoms? Your body may experience  negative effects as it tries to get used to not having nicotine in the system. These effects are called withdrawal symptoms. They may include:  Feeling hungrier than normal.  Trouble concentrating.  Irritability.  Trouble sleeping.  Feeling depressed.  Restlessness and agitation.  Craving a cigarette.  To manage withdrawal symptoms:  Avoid places, people, and activities that trigger your cravings.  Remember why you want to quit.  Get plenty of sleep.  Avoid coffee and other caffeinated drinks. These may worsen some of your symptoms.  How can I handle social situations? Social situations can be difficult when you are quitting smoking, especially in the first few weeks. To manage this, you can:  Avoid parties, bars, and other social situations where people might be smoking.  Avoid alcohol.  Leave right away if you have the  urge to smoke.  Explain to your family and friends that you are quitting smoking. Ask for understanding and support.  Plan activities with friends or family where smoking is not an option.  What are some ways I can cope with stress? Wanting to smoke may cause stress, and stress can make you want to smoke. Find ways to manage your stress. Relaxation techniques can help. For example:  Breathe slowly and deeply, in through your nose and out through your mouth.  Listen to soothing, relaxing music.  Talk with a family member or friend about your stress.  Light a candle.  Soak in a bath or take a shower.  Think about a peaceful place.  What are some ways I can prevent weight gain? Be aware that many people gain weight after they quit smoking. However, not everyone does. To keep from gaining weight, have a plan in place before you quit and stick to the plan after you quit. Your plan should include:  Having healthy snacks. When you have a craving, it may help to: ? Eat plain popcorn, crunchy carrots, celery, or other cut vegetables. ? Chew  sugar-free gum.  Changing how you eat: ? Eat small portion sizes at meals. ? Eat 4-6 small meals throughout the day instead of 1-2 large meals a day. ? Be mindful when you eat. Do not watch television or do other things that might distract you as you eat.  Exercising regularly: ? Make time to exercise each day. If you do not have time for a long workout, do short bouts of exercise for 5-10 minutes several times a day. ? Do some form of strengthening exercise, like weight lifting, and some form of aerobic exercise, like running or swimming.  Drinking plenty of water or other low-calorie or no-calorie drinks. Drink 6-8 glasses of water daily, or as much as instructed by your health care provider.  Summary  Quitting smoking is a physical and mental challenge. You will face cravings, withdrawal symptoms, and temptation to smoke again. Preparation can help you as you go through these challenges.  You can cope with cravings by keeping your mouth busy (such as by chewing gum), keeping your body and hands busy, and making calls to family, friends, or a helpline for people who want to quit smoking.  You can cope with withdrawal symptoms by avoiding places where people smoke, avoiding drinks with caffeine, and getting plenty of rest.  Ask your health care provider about the different ways to prevent weight gain, avoid stress, and handle social situations. This information is not intended to replace advice given to you by your health care provider. Make sure you discuss any questions you have with your health care provider. Document Released: 03/19/2016 Document Revised: 03/19/2016 Document Reviewed: 03/19/2016 Elsevier Interactive Patient Education  2018 Elsevier Inc[employer]        Influenza Virus Vaccine injection What is this medicine? INFLUENZA VIRUS VACCINE (in floo EN zuh VAHY ruhs vak SEEN) helps to reduce the risk of getting influenza also known as the flu. The vaccine only helps protect you  against some strains of the flu. This medicine may be used for other purposes; ask your health care provider or pharmacist if you have questions. COMMON BRAND NAME(S): Afluria, Agriflu, Alfuria, FLUAD, Fluarix, Fluarix Quadrivalent, Flublok, Flublok Quadrivalent, FLUCELVAX, Flulaval, Fluvirin, Fluzone, Fluzone High-Dose, Fluzone Intradermal What should I tell my health care provider before I take this medicine? They need to know if you have any of these conditions: -bleeding  disorder like hemophilia -fever or infection -Guillain-Barre syndrome or other neurological problems -immune system problems -infection with the human immunodeficiency virus (HIV) or AIDS -low blood platelet counts -multiple sclerosis -an unusual or allergic reaction to influenza virus vaccine, latex, other medicines, foods, dyes, or preservatives. Different brands of vaccines contain different allergens. Some may contain latex or eggs. Talk to your doctor about your allergies to make sure that you get the right vaccine. -pregnant or trying to get pregnant -breast-feeding How should I use this medicine? This vaccine is for injection into a muscle or under the skin. It is given by a health care professional. A copy of Vaccine Information Statements will be given before each vaccination. Read this sheet carefully each time. The sheet may change frequently. Talk to your healthcare provider to see which vaccines are right for you. Some vaccines should not be used in all age groups. Overdosage: If you think you have taken too much of this medicine contact a poison control center or emergency room at once. NOTE: This medicine is only for you. Do not share this medicine with others. What if I miss a dose? This does not apply. What may interact with this medicine? -chemotherapy or radiation therapy -medicines that lower your immune system like etanercept, anakinra, infliximab, and adalimumab -medicines that treat or prevent  blood clots like warfarin -phenytoin -steroid medicines like prednisone or cortisone -theophylline -vaccines This list may not describe all possible interactions. Give your health care provider a list of all the medicines, herbs, non-prescription drugs, or dietary supplements you use. Also tell them if you smoke, drink alcohol, or use illegal drugs. Some items may interact with your medicine. What should I watch for while using this medicine? Report any side effects that do not go away within 3 days to your doctor or health care professional. Call your health care provider if any unusual symptoms occur within 6 weeks of receiving this vaccine. You may still catch the flu, but the illness is not usually as bad. You cannot get the flu from the vaccine. The vaccine will not protect against colds or other illnesses that may cause fever. The vaccine is needed every year. What side effects may I notice from receiving this medicine? Side effects that you should report to your doctor or health care professional as soon as possible: -allergic reactions like skin rash, itching or hives, swelling of the face, lips, or tongue Side effects that usually do not require medical attention (report to your doctor or health care professional if they continue or are bothersome): -fever -headache -muscle aches and pains -pain, tenderness, redness, or swelling at the injection site -tiredness This list may not describe all possible side effects. Call your doctor for medical advice about side effects. You may report side effects to FDA at 1-800-FDA-1088. Where should I keep my medicine? The vaccine will be given by a health care professional in a clinic, pharmacy, doctor's office, or other health care setting. You will not be given vaccine doses to store at home. NOTE: This sheet is a summary. It may not cover all possible information. If you have questions about this medicine, talk to your doctor, pharmacist, or  health care provider.  2018 Elsevier/Gold Standard (2014-10-11 10:07:28)

## 2017-12-21 NOTE — Assessment & Plan Note (Signed)
Walk in office today   6 min walk at next appointment in 2 months with Dr. Marchelle Gearingamaswamy  Regular flu vaccine today   Continue Symbicort 80 >>> 2 puffs in the morning right when you wake up, rinse out your mouth after use, 12 hours later 2 puffs, rinse after use >>> Take this daily, no matter what >>> This is not a rescue inhaler   May/2020 we will repeat a high-res CT of your lungs   Follow up with Dr Sherene SiresWert as needed and in 6months   We recommend that you stop smoking.   Please contact your primary care regarding starting Chantix or Wellbutrin.  Discussed with them your intentions to stop smoking.  Smoking Cessation Resources:  1 800 QUIT NOW  >>> Patient to call this resource and utilize it to help support her quit smoking >>> Keep up your hard work with stopping smoking  [facility] also [facility] >>>For smoking cessation classes call [phone]  We do not recommend using e-cigarettes as a form of stopping smoking

## 2017-12-21 NOTE — Addendum Note (Signed)
Addended by: Coral CeoMACK, Ajahni Nay P on: 12/21/2017 01:02 PM   Modules accepted: Orders

## 2017-12-21 NOTE — Assessment & Plan Note (Signed)
Augmentin >>> Take 1 875-125 mg tablet every 12 hours for the next 7 days >>> Take with food

## 2017-12-21 NOTE — Assessment & Plan Note (Addendum)
Labwork today  >>> ANA with reflex, anti-scleroderma antibody, CCP, rheumatoid factor, ESR, SSA and SSB  May/2020 we will repeat a high-res CT of your lungs   Follow up with Dr. Chase Caller in [facility] clinic in 2 months  >>>47mn visit

## 2017-12-21 NOTE — Assessment & Plan Note (Signed)
Follow up with Dr Sherene SiresWert as needed and in 6months   We recommend that you stop smoking.   Please contact your primary care regarding starting Chantix or Wellbutrin.  Discussed with them your intentions to stop smoking.  Smoking Cessation Resources:  1 800 QUIT NOW  >>> Patient to call this resource and utilize it to help support her quit smoking >>> Keep up your hard work with stopping smoking  [facility] also [facility] the [facility] >>>For smoking cessation classes call [phone]  We do not recommend using e-cigarettes as a form of stopping smoking

## 2017-12-21 NOTE — Progress Notes (Signed)
_0  ID: Katherine [name], female    DOB: 1970-12-01, 47 y.o.   MRN: [id number]  Chief Complaint  Patient presents with  . Follow-up    review CT and PFT- prod cough with dark yellowish mucus, wheezing, occ sob with exertion & chest tightness     Referring provider: [place], [place] [facility]  HPI:  47 year old female current every day smoker followed in our office for COPD Gold 0, NSIP like findings in May/2019 CT high res   Smoker/ Smoking History: Current every day smoker. 0.5 ppd. 30 pack years Maintenance: Symbicort 80 Pt of: Dr. Melvyn Novas, Consulting with MR in ILD clinic  Recent [place] [facility] Encounters:   06/27/2017-COPD exacerbation-[name] Patient reports today that she is short of breath at all times, nonproductive cough.  Patient was recently treated for pneumonia on 06/23/2017.  Plan: Continue Symbicort 80, stop Spiriva, prednisone taper, start PPI and Pepcid  12/21/2017  - Visit   47 year old patient presenting today for follow-up visit.  Per telephone encounter it appears that on a previous CT scan patient had ILD-like changes.  This prompted a high-res CT to be completed in May/2019.  High-res CT showed slight ILD / NSIP.  Recent spirometry with DLCO complete in August/2019 showed a DLCO of 37.  Patient reports progressive shortness of breath that started 3 weeks ago.  Patient reports adherence to Symbicort.  Patient reports she is had to use her rescue inhaler 2 times in the last week.  Patient also reporting increased productive cough with yellow mucous, postnasal drip, denies fevers.   Patient is still smoking.  Patient with 30-pack-year smoking history.  Patient is a smoking 0.5 ppd.    Tests:   08/10/2017-CT chest high-res-very subtle changes in the lung bases which could be indicative of interstitial lung disease at this time CT features are most consistent with non-IPF diagnosis nonspecific NSIP, repeat high-res in 12 months, mild diffuse bronchial wall thickening and  mild centrilobular and paraseptal emphysema, COPD, aortic arthrosclerosis  12/01/2017-pulmonary function test- FVC 2.26 (63% predicted), ratio 85, FEV1 68, DLCO 37   04/30/2016-echocardiogram-LV ejection fraction 55 to 60%,  05/03/16 - ANA reflex - negative 05/03/16 - ANA titers - <1:20 05/03/16 - CRP - 8.2 05/03/16 - CCP - 10 05/03/16 - Rh - 28.3 05/03/16 - Myelo / ANCA - negative   Chart Review:     Specialty Problems      Pulmonary Problems   Acute respiratory failure with hypoxia (HCC)    09/24/2016  Walked RA x 3 laps @ 185 ft each stopped due to  End of study, her pace(slow side of nl), no sob or desat  > no need for 02        Pneumonia   Hypoxia   Respiratory distress   COPD with acute exacerbation (HCC)   COPD GOLD 0 still smoking     09/24/2016  d/c spiriva/ continue symb 80 - PFT's  09/24/2016  FEV1 1.94 (67 % ) ratio 87  p no % improvement from saba p nothing prior to study with DLCO  40/38 % corrects to 55  % for alv volume   - 01/18/2017  After extensive coaching HFA effectiveness =    75% from a baseline of 25%   08/10/2017-CT chest high-res-very subtle changes in the lung bases which could be indicative of interstitial lung disease at this time CT features are most consistent with non-IPF diagnosis nonspecific NSIP, repeat high-res in 12 months, mild diffuse bronchial wall thickening and mild  centrilobular and paraseptal emphysema, COPD, aortic arthrosclerosis  12/01/2017-pulmonary function test- FVC 2.26 (63% predicted), ratio 85, FEV1 68, DLCO 37  6 min walk>>>       Sinusitis, acute maxillary      Allergies  Allergen Reactions  . Doxycycline Swelling    Immunization History  Administered Date(s) Administered  . Influenza,inj,Quad PF,6+ Mos 01/18/2017, 12/21/2017  . Pneumococcal Polysaccharide-23 04/30/2016   >>>Flu vaccine today   Past Medical History:  Diagnosis Date  . Angina at [facility])   . COPD (chronic obstructive pulmonary disease) ([place])   .  ETOH abuse   . Hyperlipidemia   . Hypoxia   . Schizophrenia ([place])   . Tobacco use     Tobacco History: Social History   Tobacco Use  Smoking Status Current Every Day Smoker  . Packs/day: 0.25  . Years: 30.00  . Pack years: 7.50  . Types: Cigarettes  Smokeless Tobacco Never Used  Tobacco Comment   down to 0.5ppd    Ready to quit: Not Answered Counseling given: Not Answered Comment: down to 0.5ppd    Smoking assessment and cessation counseling  Patient currently smoking: 0.5 pack daily  I have advised the patient to quit/stop smoking as soon as possible due to high risk for multiple medical problems.  It will also be very difficult for [name] to manage patient's  respiratory symptoms and status if we continue to expose her lungs to a known irritant.  We do not advise e-cigarettes as a form of stopping smoking.  Patient is willing to quit smoking.  I have advised the patient that we can assist and have options of nicotine replacement therapy, provided smoking cessation education today, provided smoking cessation counseling, and provided cessation resources.   Plan: Patient is interested in trying Chantix or Wellbutrin.  Patient reports that nicotine patches did not work for her in the past.  Patient will discuss starting Chantix or Wellbutrin with primary care as she is also on Abilify.  Follow-up next office visit office visit for assessment of smoking cessation.  Smoking cessation counseling advised for: 59mn    Outpatient Encounter Medications as of 12/21/2017  Medication Sig  . acetaminophen (TYLENOL) 325 MG tablet Take 650 mg by mouth every 6 (six) hours as needed for moderate pain or headache.  . albuterol (PROVENTIL HFA;VENTOLIN HFA) 108 (90 Base) MCG/ACT inhaler Inhale 2 puffs into the lungs every 6 (six) hours as needed for wheezing or shortness of breath.  . ARIPiprazole (ABILIFY) 5 MG tablet Take 5 mg by mouth daily.  . budesonide-formoterol (SYMBICORT) 80-4.5 MCG/ACT  inhaler Inhale 2 puffs into the lungs 2 (two) times daily.  . cetirizine (ZYRTEC) 10 MG tablet Take 10 mg by mouth daily.  . fluticasone (FLONASE) 50 MCG/ACT nasal spray Place 2 sprays into both nostrils daily.  .Marland Kitchengabapentin (NEURONTIN) 300 MG capsule Take 300 mg by mouth 3 (three) times daily.  . sertraline (ZOLOFT) 25 MG tablet Take 25 mg by mouth daily.  . simvastatin (ZOCOR) 20 MG tablet Take 20 mg by mouth daily.  . [DISCONTINUED] predniSONE (DELTASONE) 10 MG tablet Take  4 each am x 2 days,   2 each am x 2 days,  1 each am x 2 days and stop  . amoxicillin-clavulanate (AUGMENTIN) 875-125 MG tablet Take 1 tablet by mouth 2 (two) times daily.  . budesonide-formoterol (SYMBICORT) 80-4.5 MCG/ACT inhaler Inhale 2 puffs into the lungs 2 (two) times daily.  . [DISCONTINUED] Fluticasone-Umeclidin-Vilant (TRELEGY ELLIPTA) 100-62.5-25 MCG/INH AEPB Inhale  1 puff into the lungs daily for 1 day.   No facility-administered encounter medications on file as of 12/21/2017.      Review of Systems  Review of Systems  Constitutional: Positive for fatigue. Negative for chills, fever and unexpected weight change.  HENT: Positive for postnasal drip. Negative for congestion, ear pain, sinus pressure and sinus pain.   Respiratory: Positive for cough (yellow mucous ), chest tightness, shortness of breath and wheezing (worse laying down).   Cardiovascular: Negative for chest pain and palpitations.  Gastrointestinal: Negative for blood in stool, diarrhea, nausea and vomiting.  Genitourinary: Negative for dysuria, frequency and urgency.  Musculoskeletal: Negative for arthralgias.  Skin: Negative for color change.  Allergic/Immunologic: Negative for environmental allergies and food allergies.  Neurological: Negative for dizziness, light-headedness and headaches.  Psychiatric/Behavioral: Negative for dysphoric mood. The patient is not nervous/anxious.   All other systems reviewed and are negative.    Physical  Exam  BP 126/68 (BP Location: Left Arm, Cuff Size: Normal)   Pulse 82   Ht 5' 3.5" (1.613 m)   Wt 167 lb (75.8 kg)   SpO2 94%   BMI 29.12 kg/m   Wt Readings from Last 5 Encounters:  12/21/17 167 lb (75.8 kg)  06/27/17 164 lb (74.4 kg)  01/18/17 156 lb (70.8 kg)  09/24/16 146 lb (66.2 kg)  07/14/16 153 lb (69.4 kg)    >>> pt smells of cigarette smoke   Physical Exam  Constitutional: She is oriented to person, place, and time and well-developed, well-nourished, and in no distress. No distress.  HENT:  Head: Normocephalic and atraumatic.  Right Ear: Hearing, tympanic membrane, external ear and ear canal normal.  Left Ear: Hearing, tympanic membrane, external ear and ear canal normal.  Nose: Mucosal edema and rhinorrhea present. Right sinus exhibits maxillary sinus tenderness and frontal sinus tenderness. Left sinus exhibits no maxillary sinus tenderness and no frontal sinus tenderness.  Mouth/Throat: Uvula is midline and oropharynx is clear and moist. No oropharyngeal exudate.  Eyes: Pupils are equal, round, and reactive to light.  Neck: Normal range of motion. Neck supple. No JVD present.  Cardiovascular: Normal rate, regular rhythm and normal heart sounds.  Pulmonary/Chest: Effort normal and breath sounds normal. No accessory muscle usage. No respiratory distress. She has no decreased breath sounds. She has no wheezes. She has no rhonchi.  Musculoskeletal: Normal range of motion. She exhibits no edema.  Lymphadenopathy:    She has no cervical adenopathy.  Neurological: She is alert and oriented to person, place, and time. Gait normal.  Skin: Skin is warm and dry. She is not diaphoretic. No erythema.  Psychiatric: Mood, memory, affect and judgment normal.  Nursing note and vitals reviewed.   12/21/2017-walk in office-patient did 3 laps and oxygen saturations did not drop below 94% on room air  Lab Results:  CBC    Component Value Date/Time   WBC 9.0 05/07/2016 0936   RBC  3.54 (L) 05/07/2016 0936   HGB 11.3 (L) 05/07/2016 0936   HCT 34.3 (L) 05/07/2016 0936   PLT 150 05/07/2016 0936   MCV 96.9 05/07/2016 0936   MCH 31.9 05/07/2016 0936   MCHC 32.9 05/07/2016 0936   RDW 17.0 (H) 05/07/2016 0936   LYMPHSABS 2.7 05/07/2016 0936   MONOABS 0.2 05/07/2016 0936   EOSABS 0.0 05/07/2016 0936   BASOSABS 0.0 05/07/2016 0936    BMET    Component Value Date/Time   NA 135 05/07/2016 0936   K 3.0 (L) 05/07/2016 1761  CL 96 (L) 05/07/2016 0936   CO2 28 05/07/2016 0936   GLUCOSE 110 (H) 05/07/2016 0936   BUN 8 05/07/2016 0936   CREATININE 0.84 05/07/2016 0936   CALCIUM 8.8 (L) 05/07/2016 0936   GFRNONAA >60 05/07/2016 0936   GFRAA >60 05/07/2016 0936    BNP No results found for: BNP  ProBNP No results found for: PROBNP  Imaging: No results found.    Assessment & Plan:   Pleasant 47 year old patient seen office visit today.  We will have patient get repeat lab work: ANA with reflex, anti-scleroderma antibody, CCP, rheumatoid factor, ESR, SSA and SSB.  We will repeat high-res CT in May/2020. 8mn walk at next visit with MR. Consider repeating PFTs next year.  We will have patient follow-up with Dr. RChase Callerin 2 months in ILD clinic.  Patient can follow-up with Dr. WMelvyn Novasin 6 months.    Emphasized need to stop smoking. Flu shot today.    COPD GOLD 0 still smoking  Walk in office today   6 min walk at next appointment in 2 months with Dr. RChase Caller Regular flu vaccine today   Continue Symbicort 80 >>> 2 puffs in the morning right when you wake up, rinse out your mouth after use, 12 hours later 2 puffs, rinse after use >>> Take this daily, no matter what >>> This is not a rescue inhaler   May/2020 we will repeat a high-res CT of your lungs   Follow up with Dr WMelvyn Novasas needed and in 652month  We recommend that you stop smoking.   Please contact your primary care regarding starting Chantix or Wellbutrin.  Discussed with them your intentions to  stop smoking.  Smoking Cessation Resources:  1 800 QUIT NOW  >>> Patient to call this resource and utilize it to help support her quit smoking >>> Keep up your hard work with stopping smoking  You can also contact the [facility]>>For smoking cessation classes call [phone] do not recommend using e-cigarettes as a form of stopping smoking   Cigarette smoker Follow up with Dr WeMelvyn Novass needed and in 17m78month We recommend that you stop smoking.   Please contact your primary care regarding starting Chantix or Wellbutrin.  Discussed with them your intentions to stop smoking.  Smoking Cessation Resources:  1 800 QUIT NOW  >>> Patient to call this resource and utilize it to help support her quit smoking >>> Keep up your hard work with stopping smoking  You can also contact the [facility]>For smoking cessation classes call [phone] do not recommend using e-cigarettes as a form of stopping smoking   Sinusitis, acute maxillary Augmentin >>> Take 1 875-125 mg tablet every 12 hours for the next 7 days >>> Take with food   Abnormal finding on lung imaging Labwork today  >>> ANA with reflex, anti-scleroderma antibody, CCP, rheumatoid factor, ESR, SSA and SSB  May/2020 we will repeat a high-res CT of your lungs   Follow up with Dr. RamChase Caller ILD clinic in 2 months  >>>[id number]       BriaLauraine Rinne 12/21/2017

## 2017-12-22 LAB — SJOGREN'S SYNDROME ANTIBODS(SSA + SSB)
SSA (Ro) (ENA) Antibody, IgG: <1 AI
SSB (LA) (ENA) ANTIBODY, IGG: NEGATIVE AI

## 2017-12-22 LAB — CYCLIC CITRUL PEPTIDE ANTIBODY, IGG

## 2017-12-22 LAB — ANTI-SCLERODERMA ANTIBODY: SCLERODERMA (SCL-70) (ENA) ANTIBODY, IGG: NEGATIVE AI

## 2017-12-22 LAB — RHEUMATOID FACTOR: Rheumatoid fact SerPl-aCnc: 30 IU/mL — ABNORMAL HIGH (ref <14)

## 2017-12-22 LAB — ANA W/REFLEX: Anti Nuclear Antibody(ANA): NEGATIVE

## 2017-12-25 NOTE — Progress Notes (Signed)
Chart and office note reviewed in detail  > agree with a/p as outlined    

## 2017-12-27 NOTE — Progress Notes (Signed)
Lab work is very similar to how it was a year ago.  Mildly elevated rheumatoid factor.    Keep follow-up with our office and with Dr. Marchelle Gearingamaswamy for ILD clinic.  Elisha HeadlandBrian Mack, FNP

## 2018-02-28 ENCOUNTER — Ambulatory Visit: Payer: Medicaid Other | Admitting: Internal Medicine

## 2018-02-28 ENCOUNTER — Encounter: Payer: Self-pay | Admitting: Internal Medicine

## 2018-02-28 VITALS — BP 112/66 | HR 80 | Ht 63.5 in | Wt 169.4 lb

## 2018-02-28 DIAGNOSIS — F17200 Nicotine dependence, unspecified, uncomplicated: Secondary | ICD-10-CM

## 2018-02-28 DIAGNOSIS — J849 Interstitial pulmonary disease, unspecified: Secondary | ICD-10-CM

## 2018-02-28 DIAGNOSIS — J209 Acute bronchitis, unspecified: Secondary | ICD-10-CM

## 2018-02-28 DIAGNOSIS — J440 Chronic obstructive pulmonary disease with acute lower respiratory infection: Secondary | ICD-10-CM | POA: Diagnosis not present

## 2018-02-28 MED ORDER — CEPHALEXIN 500 MG PO CAPS: 500.0000 mg | ORAL_CAPSULE | Freq: Four times a day (QID) | ORAL | 0 refills | Status: DC

## 2018-02-28 MED ORDER — PREDNISONE 10 MG PO TABS: ORAL_TABLET | ORAL | 0 refills | Status: DC

## 2018-02-28 NOTE — Patient Instructions (Addendum)
ICD-10-CM   1. ILD (interstitial lung disease) (HCC) J84.9   2. Smoking F17.200   3. 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

## 2018-02-28 NOTE — Progress Notes (Addendum)
SIGNIFICANT EVENTS: 1/25  Presents to OSH with severe respiratory distress  1/26 1L IV fluids for mild hypotension overnight > tachypnea.  Then lasix + BiPAP with improvement.   06/23/2016  Hospital Follow Up:  Katherine Katherine [name] is Katherine [name] 47 y.o. female with PMH of ETOH, tobacco use, hyperlipidemia and schizophrenia. She was seen during Katherine [name] hospital admission from 04/29/2016-05/07/2016 for acute hypoxic respiratory failure possibly due to pneumonia vs. Viral illness.She presents for hospital follow up.     Pt. Presents for hospital follow up of acute respiratory failure secondary to pneumonia/ ? ILD / ? viral etiology.Marland Kitchen She was treated with ABX, steroids, oxygen, scheduled BD's , prn BIPAP and lasix. She improved clinically and was discharged home on Katherine [name] prednisone taper and oxygen.She completed her pred taper today. She is not wheezing and states her dyspnea is better. She did not take  prednisone prior to this admission.She is not wearing her oxygen today, but states that she does use it at times. She describes an intermittent  anginal type pain that resolves with oxygen. She does take Katherine [name] statin for hyperlipidemia. 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 We had Katherine [name] long discussion regarding the reasons she needs to quit. We specifically discussed the risk of smoking while wearing oxygen. She verbalized understanding of the risk of explosion and flash burns.She denies fever, orthopnea or hemoptysis. She states she rarely coughs.Secretions are clear to light yellow.She states she is compliant with her symbicort and spiriva daily. She states she uses her rescue inhaler about once daily. We did discuss that she should consider getting Katherine [name] primary care physician.She told me she is seen on Katherine [name] regular basis by [facility].  She states she is feeling better. She weaning herself from her oxygen. She is continuing to smoke 1/2 PPD.She does use her oxygen occasionally  for intermittent shortness of breath She has been weaning oxygen. Referral to cardiology for anginal type pain that is intermittent and self resolves with oxygen use. False left eye  Tests STUDIES:  Echo 1/26 > LVEF 55%- 60%, normal wall motion CT chest 1/28 > Diffuse bilateral interstitial opacity with geographic ground-glass opacities throughout the lungs favoring the lung bases, and some confluent dependent airspace opacities in the lower lobes. Secondary pulmonary lobular thickening.   -Autoimmune workup: ANA negative, ANCA WNL, CCP WNL, RF 28.3 elevated -ESR and CRP elevated   06/27/2017 acute extended ov/[name] re:    Aecopd/ still smoking  Chief Complaint  Patient presents with  . Acute Visit    SOB at all times, nonproductive cough, was dx: with pneumoina on 06/23/17   2 weeks prior to ov doing fine on symb 80 2bid and no rescue at all Then abruptly ill with cough/ sob > cxr dx pna > rx ? levaquin x 5 days/ pred also and added spiriva  Started with worse sob/ cough esp lying down. Congested cough on liquid mucinex, can't actually cough up any mucus Used up her saba and no worse since ran out - note poor baseline hfa   No obvious day to day or daytime variability or assoc ongoing  excess/ purulent sputum or mucus plugs or hemoptysis or cp or chest tightness, subjective wheeze or overt sinus or hb symptoms. No unusual exposure hx or h/o childhood pna/ asthma or knowledge of premature birth.     06/23/2016 NP note Hospital Follow Up: Pt. Presents for hospital follow up of acute respiratory failure secondary to  pneumonia/ ? ILD / ? viral etiology.Marland Kitchen She was treated with ABX, steroids, oxygen, scheduled BD's , prn BIPAP and lasix. She improved clinically and was discharged home on Katherine [name] prednisone taper and oxygen.She completed her pred taper today. She is not wheezing and states her dyspnea is better. She did not take  prednisone prior to this admission.She is not wearing her oxygen today,  but states that she does use it at times. She describes an intermittent  anginal type pain that resolves with oxygen. She does take Katherine [name] statin for hyperlipidemia. 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 We had Katherine [name] long discussion regarding the reasons she needs to quit. We specifically discussed the risk of smoking while wearing oxygen. She verbalized understanding of the risk of explosion and flash burns.She denies fever, orthopnea or hemoptysis. She states she rarely coughs.Secretions are clear to light yellow.She states she is compliant with her symbicort and spiriva daily. She states she uses her rescue inhaler about once daily. We did discuss that she should consider getting Katherine [name] primary care physician.She told me she is seen on Katherine [name] regular basis by [name]  She states she is feeling better. She weaning herself from her oxygen. She is continuing to smoke 1/2 PPD.She does use her oxygen occasionally for intermittent shortness of breath She has been weaning oxygen. Referral to cardiology for anginal type pain that is intermittent and self resolves with oxygen use. False left eye rec It is nice to meet you today. We will walk you today in the office to see if you continue to need your oxygen Do not use oxygen while smoking. You need to quit smoking. We will schedule your follow up CT Chest without contrast, Follow up Pneumonia and suspected ILD. Referral to cardiology.( Intermittent anginal like pain) Schedule PFT's in 4 weeks, then follow up with Dr. Corrie Dandy same day. It is ok to stop prednisone. Continue your Symbicort and Spiriva daily without fail. Rinse mouth after use  Continue to use your rescue inhaler as needed for break through. Please contact office for sooner follow up if symptoms do not improve or worsen or seek emergency care     09/24/2016  f/u ov/[name] re:  COPD 0 / symb 80 2bid/spiriva and still smoking  Chief Complaint    Patient presents with  . Follow-up    PFT's done today.  Breathing has improved, but not back at her normal baseline yet. She is using her albuterol inhaler 5 x per wk on average.   doe still MMRC3 = can't walk 100 yards even at Katherine [name] slow pace at Katherine [name] flat grade s stopping due to sob eg [employer] walking / does ok at [facility] ? Some better p saba   No obvious day to day or daytime variability or assoc excess/ purulent sputum or mucus plugs or hemoptysis or cp or chest tightness, subjective wheeze or overt sinus or hb symptoms. No unusual exp hx or h/o childhood pna/ asthma or knowledge of premature birth.  Sleeping ok without nocturnal  or early am exacerbation  of respiratory  c/o's or need for noct saba. Also denies any obvious fluctuation of symptoms with weather or environmental changes or other aggravating or alleviating factors except as outlined above      OV 12/21/2017  Subjective:  Patient ID: Katherine Katherine [name], female , DOB: 1970/10/25 , age 9 y.o. , MRN: [id number] , ADDRESS: [address] [place] [address]  12/21/2017 -  No chief complaint on file.    HPI Katherine Katherine [name] 47 y.o. -     PFT Results for Katherine Katherine [name], Katherine Katherine [name] (MRN [id number]) as of 12/21/2017 09:05  Ref. Range 09/24/2016 14:38 12/01/2017 09:36  FVC-Pre Latest Units: L 2.33 2.26  FVC-%Pred-Pre Latest Units: % 64 63   Results for Katherine Katherine [name], Katherine Katherine [name] (MRN [id number]) as of 12/21/2017 09:05  Ref. Range 09/24/2016 14:38 12/01/2017 09:36  DLCO unc Latest Units: ml/min/mmHg 9.74 8.96  DLCO unc % pred Latest Units: % 40 37    CT CHEST HIGH RES - May 2019 IMPRESSION: 1. Very subtle changes in the lung bases which could be indicative of interstitial lung disease. 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 Repeat high-resolution chest CT is suggested in 12 months to assess for temporal changes in the lung parenchyma. 2. Mild diffuse  bronchial wall thickening with mild centrilobular and paraseptal emphysema; imaging findings compatible with the reported clinical history of COPD. 3. Aortic atherosclerosis, in addition to left main and 3 vessel coronary artery disease. Please note that although the presence of coronary artery calcium documents the presence of coronary artery disease, the severity of this disease and any potential stenosis cannot be assessed on this non-gated CT examination. Assessment for potential risk factor modification, dietary therapy or pharmacologic therapy may be warranted, if clinically indicated.  Aortic Atherosclerosis (ICD10-I70.0) and Emphysema (ICD10-J43.9).   Electronically Signed   By: Vinnie Langton M.D.   On: 08/11/2017 10:36   Results for Katherine Katherine [name], Katherine Katherine [name] (MRN [id number]) as of 12/21/2017 09:05  Ref. Range 05/03/2016 02:03  Anti Nuclear Antibody(ANA) Latest Ref Range: Negative  Negative  ANCA Proteinase 3 Latest Ref Range: 0.0 - 3.5 U/mL <3.5  CCP Antibodies IgG/IgA Latest Ref Range: 0 - 19 units 10  Myeloperoxidase Abs Latest Ref Range: 0.0 - 9.0 U/mL <9.0  RA Latex Turbid. Latest Ref Range: 0.0 - 13.9 IU/mL 28.3 (H)  Cytoplasmic 12/21/2017  - Visit   changes.  This p5 ppd. (C-ANCA) Latest Ref Range: Neg:<1:20 titer <1:20  P-ANCA Latest Ref Range: Neg:<1:20 titer <1:20  Atypical P-ANCA titer Latest Ref Range: Neg:<1:20 titer <1:20    ROS   OV 02/28/2018  Subjective:  Patient ID: Katherine Katherine [name], female , DOB: 1970-11-17 , age 19 y.o. , MRN: [id number] , ADDRESS: [address] [place]  [address]   02/28/2018 -   Chief Complaint  Patient presents with  . Follow-up    breathing is doing ok, trying to quit smoking, down to less than Katherine [name] half Katherine [name] pack Katherine [name] day, feeling Katherine [name] little more congested since whether changed     HPI Katherine Katherine [name] 47 y.o. -presents to the ILD clinic after being seen by nurse practitioner in September 2019.  She is Katherine [name] very pleasant  poor historian.  She tells me that she is disabled because of mental health.  she tells me that she has chronic shortness of breath for Katherine [name] few years of insidious onset.  Other than that she does not give me Katherine [name] good history.  She tells me that she smokes cigarettes and she might have mold in the house but denies any drugs of abuse.  She categorically denies any autoimmune disease.  Review of the interstitial lung disease work-up shows that she has essentially negative autoimmune profile other than trace positive rheumatoid factor but she denies any autoimmune disease or collagen vascular disease or  vasculitis.  She denies any birds in the house.  She does have ILD pattern on Katherine [name] CT chest in May 2019 that either indeterminate alternative diagnosis based on 2018 ATS criteria.  I personally visualized the film.  She also seems to have progressive decline in DLCO over time as documented above.  She has mixed emphysema features for which she takes Symbicort.  At this point in time she says for the last 5 days she feels more congested and has Katherine [name] cough.  She feels she will benefit from antibiotic and prednisone.    [place] Integrated Comprehensive ILD Questionnaire  Symptoms: She reports onset of shortness of breath gradually and progressive over time.  She has been progressive for the last several months.  She also has episodic dyspnea.  She reports level 0 dyspnea 4 at rest and doing bathroom activities.  Level 1 dyspnea for household work level 3 dyspnea for walking at her own pace and shopping and level 5 dyspnea for walking on Katherine [name] level with others of her age and walking up stairs and walking up hills.  She has Katherine [name] stop frequently.  She also reports Katherine [name] cough since 2010.  The cough is getting worse is moderate in severity.  Wakes her up at night bring sputum that is Katherine [name] low in color.  Cough is worse when she lies down it affects her voice she clears her throat.  She feels Katherine [name] tickle there is also wheezing  Past medical  history: Reports positive for COPD not otherwise specified, rheumatoid arthritis not otherwise specified but otherwise negative.  Review of systems: Positive for fatigue, arthralgia, dry eyes, [place] but no nausea vomiting, early morning headaches positive.  Family history of pulmonary disease she has Katherine [name] son with asthma but otherwise negative  Exposure history she smoked cigarettes since 1983 around half pack Katherine [name] day.  She lives with Katherine [name] smoker.  She denies any marijuana use.  Denies any cocaine or intravenous drug use  Home in hobby details: Lives in Katherine [name] 4 [place].  The mobile home is 48 years old.  Does not live in Katherine [name] damp environment.  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  Details not known.  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, industrial/product toxicity history   Simple office walk 250 feet x 3 laops 02/28/2018   O2 used Room air  Number laps completed 3  Comments about pace x  Resting Pulse Ox/HR *97% and 81/min  Final Pulse Ox/HR 90% and 112/min  Desaturated </= 88% no  Desaturated <= 3% points yes  Got Tachycardic >/= 90/min yes  Symptoms at end of test x  Miscellaneous comments x      ROS - per HPI     has Katherine [name] past medical history of Angina at rest [facility]), COPD (chronic obstructive pulmonary disease) ([place]), ETOH abuse, Hyperlipidemia, Hypoxia, Schizophrenia ([place]), and Tobacco use.   reports that she has been smoking cigarettes. She has Katherine [name] 7.50 pack-year smoking history. She has never used smokeless tobacco.  No past surgical history on file.  Allergies  Allergen Reactions  . Doxycycline Swelling    Immunization History  Administered Date(s) Administered    . Influenza,inj,Quad PF,6+ Mos 01/18/2017, 12/21/2017  .  Pneumococcal Polysaccharide-23 04/30/2016    Family History  Problem Relation Age of Onset  . Diabetes Mother   . Lung cancer Father   . Hypertension Father      Current Outpatient Medications:  .  acetaminophen (TYLENOL) 325 MG tablet, Take 650 mg by mouth every 6 (six) hours as needed for moderate pain or headache., Disp: , Rfl:  .  albuterol (PROVENTIL HFA;VENTOLIN HFA) 108 (90 Base) MCG/ACT inhaler, Inhale 2 puffs into the lungs every 6 (six) hours as needed for wheezing or shortness of breath., Disp: 1 Inhaler, Rfl: 2 .  amoxicillin-clavulanate (AUGMENTIN) 875-125 MG tablet, Take 1 tablet by mouth 2 (two) times daily., Disp: 14 tablet, Rfl: 0 .  ARIPiprazole (ABILIFY) 5 MG tablet, Take 5 mg by mouth daily., Disp: , Rfl:  .  budesonide-formoterol (SYMBICORT) 80-4.5 MCG/ACT inhaler, Inhale 2 puffs into the lungs 2 (two) times daily., Disp: 1 Inhaler, Rfl: 12 .  budesonide-formoterol (SYMBICORT) 80-4.5 MCG/ACT inhaler, Inhale 2 puffs into the lungs 2 (two) times daily., Disp: 1 Inhaler, Rfl: 0 .  cetirizine (ZYRTEC) 10 MG tablet, Take 10 mg by mouth daily., Disp: , Rfl:  .  fluticasone (FLONASE) 50 MCG/ACT nasal spray, Place 2 sprays into both nostrils daily., Disp: , Rfl:  .  gabapentin (NEURONTIN) 300 MG capsule, Take 300 mg by mouth 3 (three) times daily., Disp: , Rfl:  .  sertraline (ZOLOFT) 25 MG tablet, Take 25 mg by mouth daily., Disp: , Rfl:  .  simvastatin (ZOCOR) 20 MG tablet, Take 20 mg by mouth daily., Disp: , Rfl:       Objective:   Vitals:   02/28/18 1348  BP: 112/66  Pulse: 80  SpO2: 93%  Weight: 169 lb 6.4 oz (76.8 kg)  Height: 5' 3.5" (1.613 m)    Estimated body mass index is 29.54 kg/m as calculated from the following:   Height as of this encounter: 5' 3.5" (1.613 m).   Weight as of this encounter: 169 lb 6.4 oz (76.8 kg).  @WEIGHTCHANGE @  [employer]   02/28/18 1348  Weight: 169 lb 6.4 oz  (76.8 kg)     Physical Exam  General Appearance:    Alert, cooperative, no distress, appears stated age - OLDER , Deconditioned looking - yes , OBESE  - no, Sitting on Wheelchair -  no  Head:    Normocephalic, without obvious abnormality, atraumatic  Eyes:    PERRL, conjunctiva/corneas clear,  Ears:    Normal TM's and external ear canals, both ears  Nose:   Nares normal, septum midline, mucosa normal, no drainage    or sinus tenderness. OXYGEN ON  - no . Patient is @ ra   Throat:   Lips, mucosa, and tongue normal; teeth and gums normal. Cyanosis on lips - no  Neck:   Supple, symmetrical, trachea midline, no adenopathy;    thyroid:  no enlargement/tenderness/nodules; no carotid   bruit or JVD  Back:     Symmetric, no curvature, ROM normal, no CVA tenderness  Lungs:     Distress - no , Wheeze no, Barrell Chest - no, Purse lip breathing - no, Crackles - maybe at base   Chest Wall:    No tenderness or deformity.    Heart:    Regular rate and rhythm, S1 and S2 normal, no rub   or gallop, Murmur - no  Breast Exam:    NOT DONE  Abdomen:     Soft, non-tender, bowel sounds active all four quadrants,  no masses, no organomegaly. Visceral obesity - yes  Genitalia:   NOT DONE  Rectal:   NOT DONE  Extremities:   Extremities - normal, Has Cane - no, Clubbing - no, Edema - no  Pulses:   2+ and symmetric all extremities  Skin:   Stigmata of Connective Tissue Disease - no  Lymph nodes:   Cervical, supraclavicular, and axillary nodes normal  Psychiatric:  Neurologic:   Pleasant - yes, Anxious - yes, Flat affect - yes  CAm-ICU - neg, Alert and Oriented x 3 - yes, Moves all 4s - yes, Speech - normal, Cognition - intact           Assessment:       ICD-10-CM   1. ILD (interstitial lung disease) ([place]) J84.9   2. Smoking F17.200   3. Acute bronchitis with COPD ([place]) J44.0    J20.9        Plan:     Patient Instructions     ICD-10-CM   1. ILD (interstitial lung disease) ([place]) J84.9     2. Smoking F17.200   3. Acute bronchitis with COPD ([place]) J44.0    J20.9     You have scar tissue in lung - condition called fibrosis/ There are many reasons for this and we need to sort out  Plan - Please take prednisone 40 mg x1 day, then 30 mg x1 day, then 20 mg x1 day, then 10 mg x1 day, and then 5 mg x1 day and stop - cephalexin 55m three times daily x  5 days  - do ILD questionnaire - try to quit smoking  - do HRCT supine and prone next 4-6 weeks  - do spirometry and dlco next 4-6 weeks  Followup  ILD clinic in 4-6 weeks but after above  - depending on results you might need lung biopsy     SIGNATURE    Dr. MBrand Males M.D., F.C.C.P,  Pulmonary and Critical Care Medicine Staff Physician, CGreerDirector - Interstitial Lung Disease  Program  [place] [address] Pager: [phone] If no answer or between  15:00h - 7:00h: call [phone] Telephone: [phone]  2:24 PM 02/28/2018

## 2018-03-01 ENCOUNTER — Encounter: Payer: Self-pay | Admitting: Internal Medicine

## 2018-03-30 ENCOUNTER — Encounter (INDEPENDENT_AMBULATORY_CARE_PROVIDER_SITE_OTHER): Payer: Self-pay

## 2018-03-30 ENCOUNTER — Ambulatory Visit (INDEPENDENT_AMBULATORY_CARE_PROVIDER_SITE_OTHER)
Admission: RE | Admit: 2018-03-30 | Discharge: 2018-03-30 | Disposition: A | Discharge status: Home / Self Care | Payer: Medicaid Other | Source: Ambulatory Visit | Attending: Internal Medicine | Admitting: Internal Medicine

## 2018-03-30 DIAGNOSIS — J849 Interstitial pulmonary disease, unspecified: Secondary | ICD-10-CM | POA: Diagnosis not present

## 2019-04-02 ENCOUNTER — Telehealth: Payer: Self-pay | Admitting: Internal Medicine

## 2019-04-02 DIAGNOSIS — J449 Chronic obstructive pulmonary disease, unspecified: Secondary | ICD-10-CM

## 2019-04-02 NOTE — Telephone Encounter (Signed)
She needs to go get covid-19 tested  Also, one year ago I suspected ILD based on PFT but CT a year ago says no ILD  She needs a full PFT (first avail) -> then followup with me

## 2019-04-02 NOTE — Telephone Encounter (Signed)
Pt c/o increased chest congestion, nonprod cough, sob, fatigue X1 week. Pt states she feels like she has something to cough up but is unable to.  Denies sharp chest pains, hemoptysis, fever, sinus congestion, chills.   Has been using albuterol nebulizer (prescribed by her pcp, unsure of med name).  Has not been taking any other meds to help with s/s.  Requesting recs.  Last OV: 02/28/2018  Pharmacy: [facility].    MR please advise on recs.  Thanks!

## 2019-04-02 NOTE — Telephone Encounter (Signed)
Spoke with pt, aware of recs.  Reviewed covid testing site locations with patient.  Ordered PFT for next available.  Nothing further needed at this time- will close encounter.

## 2019-04-04 ENCOUNTER — Ambulatory Visit: Payer: Medicaid Other | Admitting: Primary Care

## 2019-04-10 ENCOUNTER — Ambulatory Visit (INDEPENDENT_AMBULATORY_CARE_PROVIDER_SITE_OTHER): Payer: Medicaid Other

## 2019-04-10 ENCOUNTER — Ambulatory Visit: Payer: Medicaid Other | Admitting: Primary Care

## 2019-04-10 ENCOUNTER — Encounter: Payer: Self-pay | Admitting: Primary Care

## 2019-04-10 ENCOUNTER — Other Ambulatory Visit: Payer: Self-pay

## 2019-04-10 VITALS — BP 118/68 | HR 80 | Temp 97.3°F | Ht 63.5 in | Wt 170.8 lb

## 2019-04-10 DIAGNOSIS — J441 Chronic obstructive pulmonary disease with (acute) exacerbation: Secondary | ICD-10-CM

## 2019-04-10 DIAGNOSIS — R918 Other nonspecific abnormal finding of lung field: Secondary | ICD-10-CM | POA: Diagnosis not present

## 2019-04-10 MED ORDER — GUAIFENESIN ER 600 MG PO TB12: 600.0000 mg | ORAL_TABLET | Freq: Two times a day (BID) | ORAL | 0 refills | Status: DC

## 2019-04-10 MED ORDER — PREDNISONE 10 MG PO TABS: ORAL_TABLET | ORAL | 0 refills | Status: DC

## 2019-04-10 NOTE — Progress Notes (Signed)
@Patient  ID: Katherine [name], female    DOB: Apr 05, 1971, 49 y.o.   MRN: [id number]  Chief Complaint  Patient presents with  . Cough    Non productive for last 2 weeks.     Referring provider: Candi Leash, PA-C  HPI: 49 year old female, current every day smoker. PMH significant for COPD GOLD 0, ILD, ETOH, hyperlipidemia, schizophrenia. Patient of Dr. Chase Caller, last seen on 02/28/18. HRCT on 03/31/19 showed no definitive evidence of ILD; pectus deformity, emphysema. Maintained on Symbicort 80 and prn albuterol. Needs annual PFTs.   04/10/2019 Patient presents today for acute visit. Reports non-productive cough x several weeks. Associated fatigue, shortness of breath and chest congestion. Reports that she had a negative covid test last week. She was given a prescription for zpack from Dr. Baird Cancer. She did notice partial improvement in her symptoms after completing. She has been taking over the counter robitussin which has helped "calm" her cough. She is still smoking, not ready to quit. Denies fever, chills, chest pain, sinus congestion.    Significant testing: Echo 1/26 > LVEF 55%- 60%, normal wall motion  CT chest 1/28 >Diffuse bilateral interstitial opacity with geographic ground-glass opacities throughout the lungs favoring the lung bases, and some confluent dependent airspace opacities in the lower lobes. Secondary pulmonary lobular thickening.   03/31/19 HRCT- No definitive evidence of interstitial lung disease. Please see discussion above. Pectus deformity with a Haller index of 3.4. Emphysema. Aortic atherosclerosis. Coronary artery calcification.  -Autoimmune workup: ANA negative, ANCA WNL, CCP WNL, RF 28.3 elevated -ESR and CRP elevate  Allergies  Allergen Reactions  . Doxycycline Swelling    Immunization History  Administered Date(s) Administered  . Influenza Inj Mdck Quad Pf 04/07/2018  . Influenza,inj,Quad PF,6+ Mos 01/18/2017, 12/21/2017  . Pneumococcal  Polysaccharide-23 04/30/2016    Past Medical History:  Diagnosis Date  . Angina at rest [facility])   . COPD (chronic obstructive pulmonary disease) ([place])   . ETOH abuse   . Hyperlipidemia   . Hypoxia   . Schizophrenia ([place])   . Tobacco use     Tobacco History: Social History   Tobacco Use  Smoking Status Current Every Day Smoker  . Packs/day: 0.25  . Years: 30.00  . Pack years: 7.50  . Types: Cigarettes  Smokeless Tobacco Never Used  Tobacco Comment   down to 0.5ppd    Ready to quit: Not Answered Counseling given: Not Answered Comment: down to 0.5ppd    Outpatient Medications Prior to Visit  Medication Sig Dispense Refill  . acetaminophen (TYLENOL) 325 MG tablet Take 650 mg by mouth every 6 (six) hours as needed for moderate pain or headache.    . albuterol (PROVENTIL HFA;VENTOLIN HFA) 108 (90 Base) MCG/ACT inhaler Inhale 2 puffs into the lungs every 6 (six) hours as needed for wheezing or shortness of breath. 1 Inhaler 2  . ARIPiprazole (ABILIFY) 5 MG tablet Take 5 mg by mouth daily.    . budesonide-formoterol (SYMBICORT) 80-4.5 MCG/ACT inhaler Inhale 2 puffs into the lungs 2 (two) times daily. 1 Inhaler 0  . gabapentin (NEURONTIN) 300 MG capsule Take 300 mg by mouth 3 (three) times daily.    . sertraline (ZOLOFT) 25 MG tablet Take 25 mg by mouth daily.    Marland Kitchen amoxicillin-clavulanate (AUGMENTIN) 875-125 MG tablet Take 1 tablet by mouth 2 (two) times daily. (Patient not taking: Reported on 04/10/2019) 14 tablet 0  . budesonide-formoterol (SYMBICORT) 80-4.5 MCG/ACT inhaler Inhale 2 puffs into the lungs 2 (two) times  daily. 1 Inhaler 12  . cephALEXin (KEFLEX) 500 MG capsule Take 1 capsule (500 mg total) by mouth 4 (four) times daily. (Patient not taking: Reported on 04/10/2019) 15 capsule 0  . cetirizine (ZYRTEC) 10 MG tablet Take 10 mg by mouth daily.    . fluticasone (FLONASE) 50 MCG/ACT nasal spray Place 2 sprays into both nostrils daily.    . predniSONE (DELTASONE) 10 MG tablet  Take 4 tabs x 1 day, 3 tabs x 1 day, 2 tab x 1 day, 1 x 1 day, then 0.5 x 1 day, then stop. (Patient not taking: Reported on 04/10/2019) 11 tablet 0  . simvastatin (ZOCOR) 20 MG tablet Take 20 mg by mouth daily.     No facility-administered medications prior to visit.   Review of Systems  Review of Systems  Constitutional: Positive for fatigue.  HENT: Negative for congestion, postnasal drip and sinus pressure.   Respiratory: Positive for cough and shortness of breath.   Cardiovascular: Negative.    Physical Exam  BP 118/68 (BP Location: Right Arm, Patient Position: Sitting, Cuff Size: Normal)   Pulse 80   Temp (!) 97.3 F (36.3 C)   Ht 5' 3.5" (1.613 m)   Wt 170 lb 12.8 oz (77.5 kg)   SpO2 94% Comment: on room air  BMI 29.78 kg/m  Physical Exam Constitutional:      Appearance: Normal appearance. She is not ill-appearing.  HENT:     Head: Normocephalic and atraumatic.     Nose: Nose normal.     Mouth/Throat:     Comments: Deferred d/t masking Cardiovascular:     Rate and Rhythm: Normal rate and regular rhythm.  Pulmonary:     Effort: Pulmonary effort is normal.     Breath sounds: Normal breath sounds.  Musculoskeletal:        General: Normal range of motion.  Skin:    General: Skin is warm and dry.  Neurological:     General: No focal deficit present.     Mental Status: She is alert and oriented to person, place, and time. Mental status is at baseline.  Psychiatric:        Mood and Affect: Mood normal.        Behavior: Behavior normal.        Thought Content: Thought content normal.        Judgment: Judgment normal.     Comments: Flat affect      Lab Results:  CBC    Component Value Date/Time   WBC 9.0 05/07/2016 0936   RBC 3.54 (L) 05/07/2016 0936   HGB 11.3 (L) 05/07/2016 0936   HCT 34.3 (L) 05/07/2016 0936   PLT 150 05/07/2016 0936   MCV 96.9 05/07/2016 0936   MCH 31.9 05/07/2016 0936   MCHC 32.9 05/07/2016 0936   RDW 17.0 (H) 05/07/2016 0936    LYMPHSABS 2.7 05/07/2016 0936   MONOABS 0.2 05/07/2016 0936   EOSABS 0.0 05/07/2016 0936   BASOSABS 0.0 05/07/2016 0936    BMET    Component Value Date/Time   NA 135 05/07/2016 0936   K 3.0 (L) 05/07/2016 0936   CL 96 (L) 05/07/2016 0936   CO2 28 05/07/2016 0936   GLUCOSE 110 (H) 05/07/2016 0936   BUN 8 05/07/2016 0936   CREATININE 0.84 05/07/2016 0936   CALCIUM 8.8 (L) 05/07/2016 0936   GFRNONAA >60 05/07/2016 0936   GFRAA >60 05/07/2016 0936    BNP No results found for: BNP  ProBNP No  results found for: PROBNP  Imaging: No results found.   Assessment & Plan:   COPD with acute exacerbation ([place]) - Reports NP cough x 2 weeks with associated shortness of breath/fatigue - Partial improvement in cough with zpack  - Checking CXR today  - RX prednisone taper (24m x 3 days; 361mx 3 days; 2091m 3 days; 1m21m3 days) - Advised mucinex 600mg44mce daily x 1-2 week for congestion  - Continue Symbicort 80 two puffs twice daily; prn albuterol hfa   Abnormal finding on lung imaging - 03/31/19 HRCT- No definitive evidence of interstitial lung disease - Needs annual PFTs and FU with Dr. RamasIllene Labrador1/08/2019

## 2019-04-10 NOTE — Assessment & Plan Note (Signed)
-   03/31/19 HRCT- No definitive evidence of interstitial lung disease - Needs annual PFTs and FU with Dr. Marchelle Gearing

## 2019-04-10 NOTE — Assessment & Plan Note (Addendum)
-   Reports NP cough x 2 weeks with associated shortness of breath/fatigue - Partial improvement in cough with zpack  - Checking CXR today  - RX prednisone taper (40mg  x 3 days; 30mg  x 3 days; 20mg  x 3 days; 10mg  x 3 days) - Advised mucinex 600mg  twice daily x 1-2 week for congestion  - Continue Symbicort 80 two puffs twice daily; prn albuterol hfa

## 2019-04-10 NOTE — Patient Instructions (Addendum)
RX: Prednisone taper as directed  Mucinex 600mg  twice daily x 1-2 weeks   Orders: CXR today Scheduled PFTs   Follow-up After PFTs with Dr. 

## 2019-04-11 ENCOUNTER — Telehealth: Payer: Self-pay | Admitting: Primary Care

## 2019-04-11 NOTE — Telephone Encounter (Signed)
Spoke with patient. She was requesting the results of her CXR from yesterday. Advised her I would send a message to [place], she verbalized understanding.   Beth, please advise on CXR results. Thanks!

## 2019-04-12 NOTE — Telephone Encounter (Signed)
CXR showed no acute findings, chronic changes which are known. She needs to follow up with PFTs and apt with ramaswamy

## 2019-04-12 NOTE — Telephone Encounter (Signed)
Unable to reach patient on contact number provided. No vmail pick up. Will have to try again later.

## 2019-04-16 NOTE — Telephone Encounter (Signed)
Spoke with the pt and notified of results and she verbalized understanding  Nothing further needed

## 2019-07-05 DEATH — deceased

## 2020-03-29 IMAGING — CT CT CHEST HIGH RESOLUTION W/O CM
2 of 6 series · 15 of 36 positions shown, 18 images · non-contrast
Comparison: 08/10/2017, 07/08/2016 and 05/02/2016.

CLINICAL DATA: Interstitial lung disease.

EXAM:
CT CHEST WITHOUT CONTRAST
TECHNIQUE: Multidetector CT imaging of the chest was performed following the
standard protocol without intravenous contrast. High resolution
imaging of the lungs, as well as inspiratory and expiratory imaging,
was performed.

[Series 2: high resolution · axial · 0.61mm/px · z∈[+1116,+1372]mm · 12 of 146 slices shown, 15 images]
[im 9/146  mediastinal]
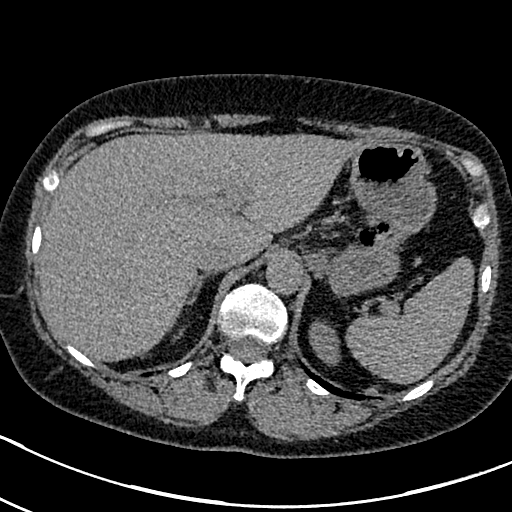
[im 9/146  lung]
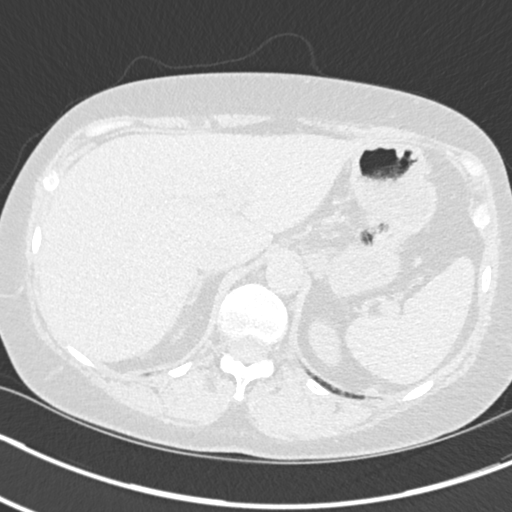
[im 25/146  lung]
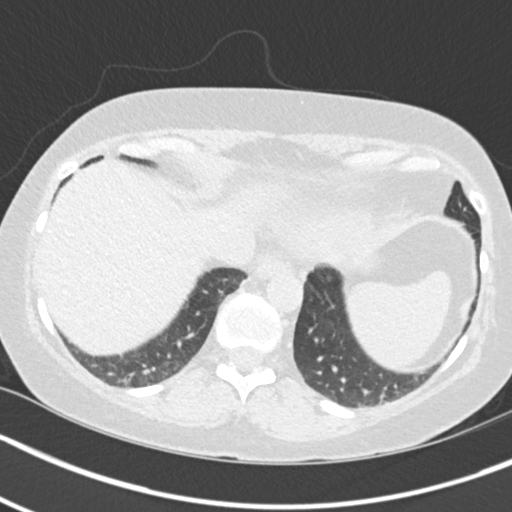
[im 33/146  lung]
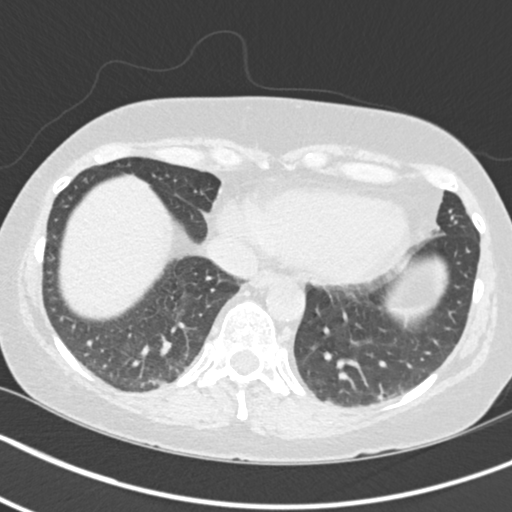
[im 41/146  lung]
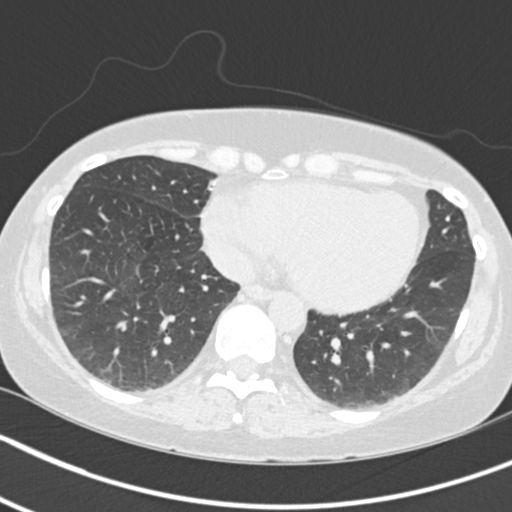
[im 57/146  mediastinal]
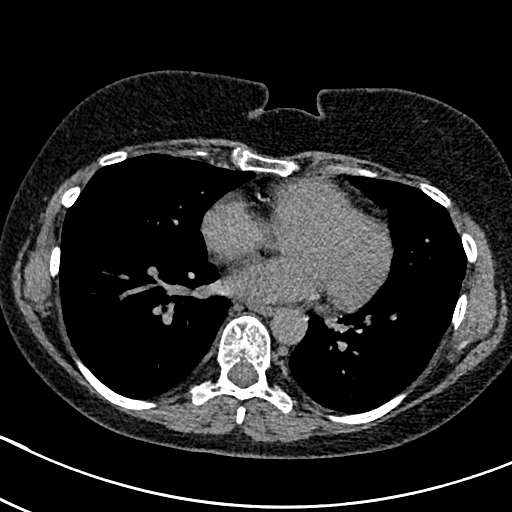
[im 57/146  lung]
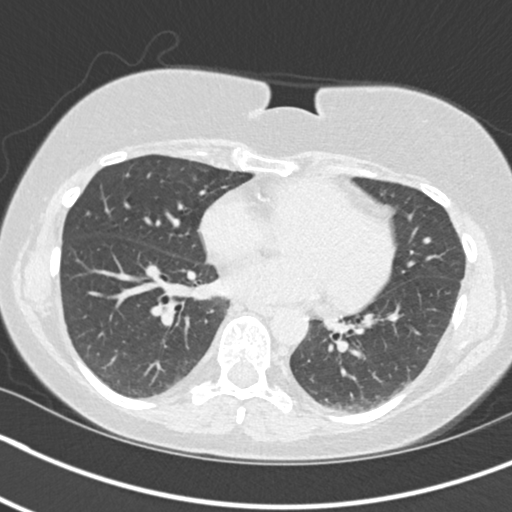
[im 65/146  lung]
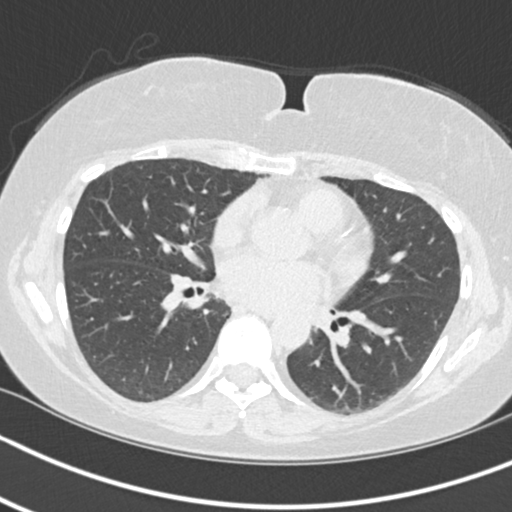
[im 81/146  lung]
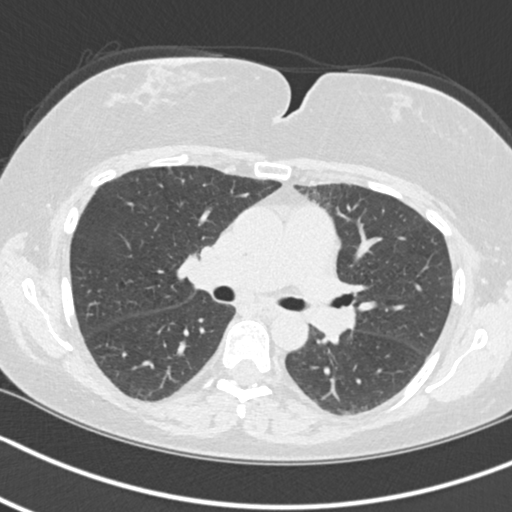
[im 89/146  lung]
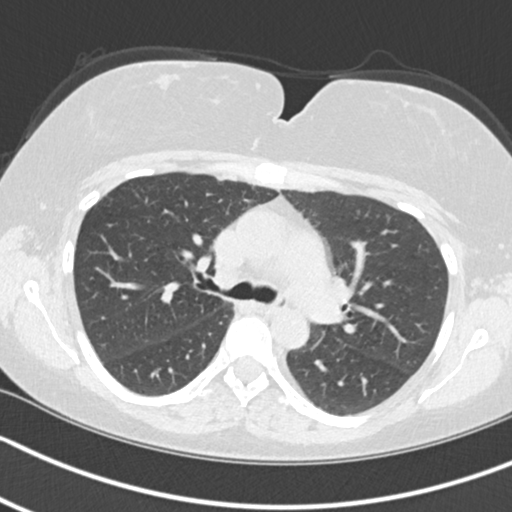
[im 105/146  mediastinal]
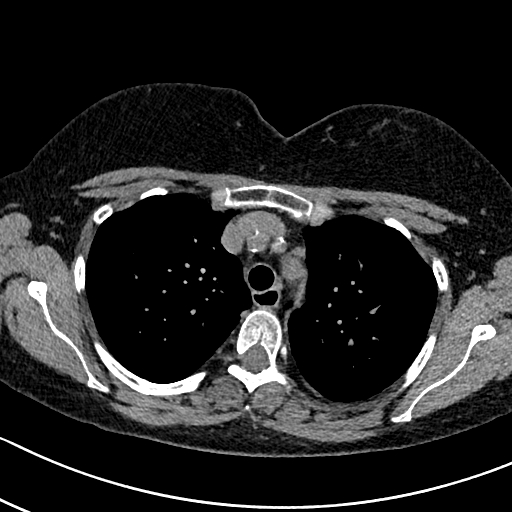
[im 105/146  lung]
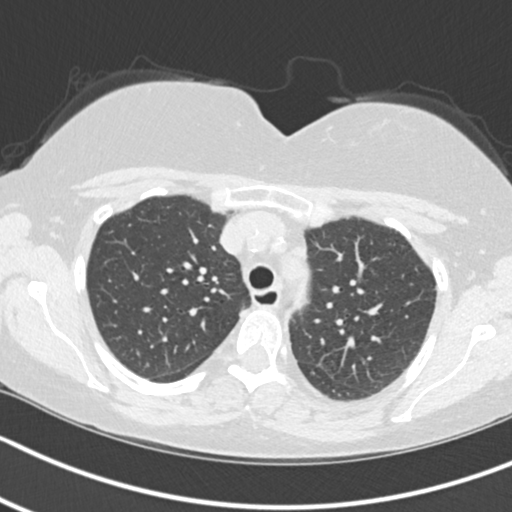
[im 113/146  lung]
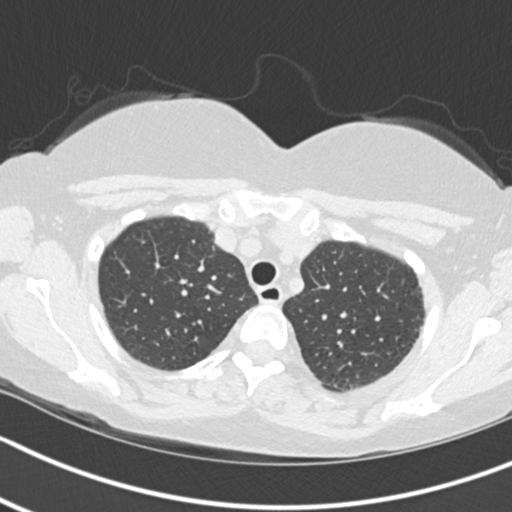
[im 121/146  lung]
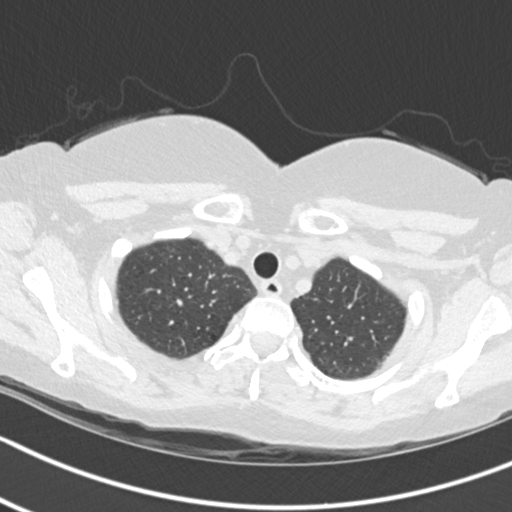
[im 137/146  lung]
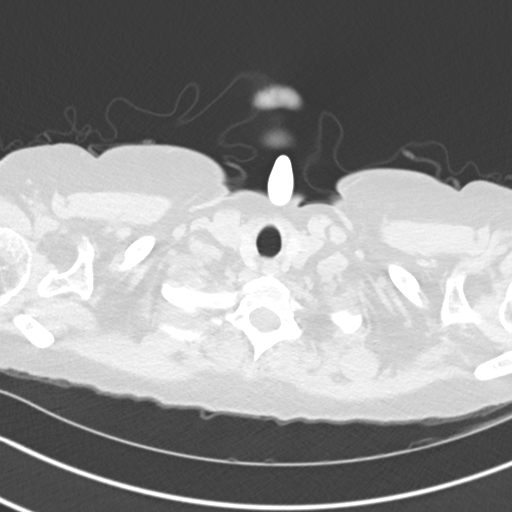

[Series 5: coronal · coronal · 0.59mm/px · 3 of 105 slices shown]
[im 21/105  lung]
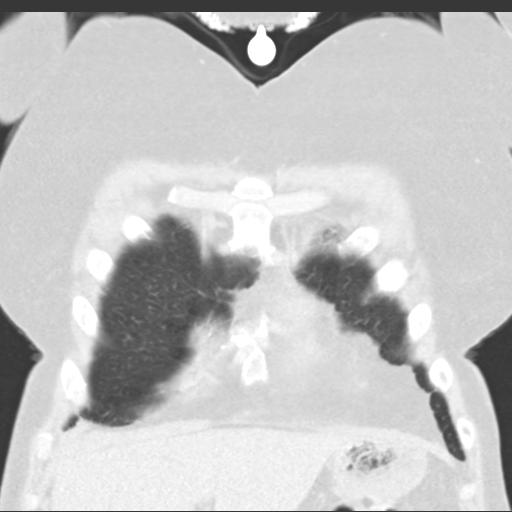
[im 42/105  lung]
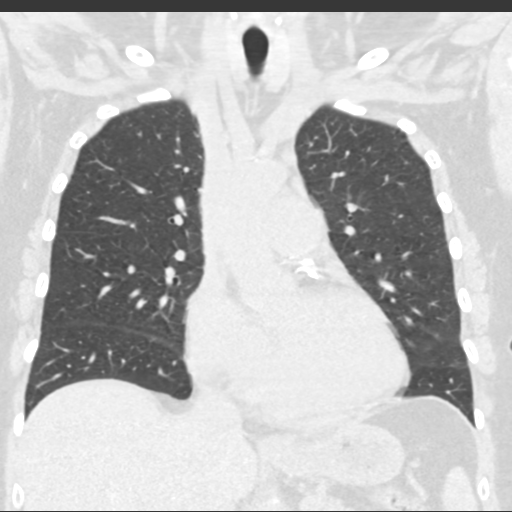
[im 63/105  lung]
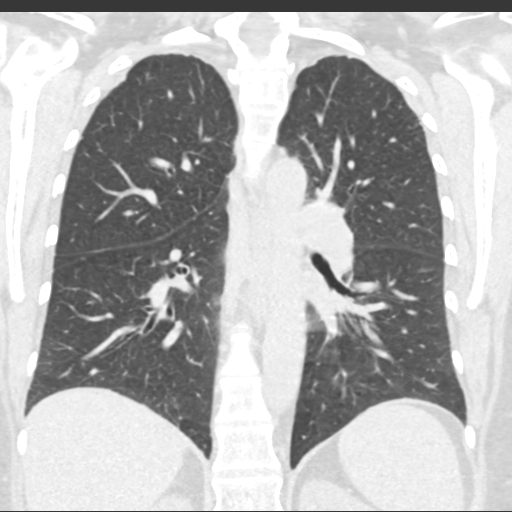

[15 of 36 positions shown; findings below may reference images not displayed]

FINDINGS: Cardiovascular: Atherosclerotic calcification of the aorta and
coronary arteries. Heart is at the upper limits of normal in size.
No pericardial effusion.

Mediastinum/Nodes: Mediastinal lymph nodes are not enlarged by CT
size criteria. Hilar regions are difficult to evaluate without IV
contrast. No axillary adenopathy. Esophagus is grossly unremarkable.

Lungs/Pleura: Biapical pleuroparenchymal scarring. Centrilobular and
paraseptal emphysema. Dependent subpleural ground-glass and volume
loss largely clear with inspiratory prone imaging with only a few
scattered nonspecific subpleural densities remaining. Lungs are
otherwise clear. No pleural fluid. Airway is unremarkable. Mild air
trapping.

Upper Abdomen: Visualized portions of the liver, adrenal glands,
kidneys, spleen, pancreas, stomach and bowel are grossly
unremarkable. No upper abdominal adenopathy.

Musculoskeletal: Pectus deformity with Shahla Stiles index of 3.4. No
worrisome lytic or sclerotic lesions.
IMPRESSION: 1. No definitive evidence of interstitial lung disease. Please see
discussion above.
2. Pectus deformity with Shahla Stiles index of 3.4.
3.  Emphysema ([id number].T).
4. Aortic atherosclerosis ([id number]-170.0). Coronary artery
calcification.

## 2021-04-09 IMAGING — DX DG CHEST 2V
2 series · 2 of 2 positions shown · non-contrast
Comparison: 03/30/2018

CLINICAL DATA: Cough and COPD, follow-up examination

EXAM:
CHEST - 2 VIEW

[chest pa]
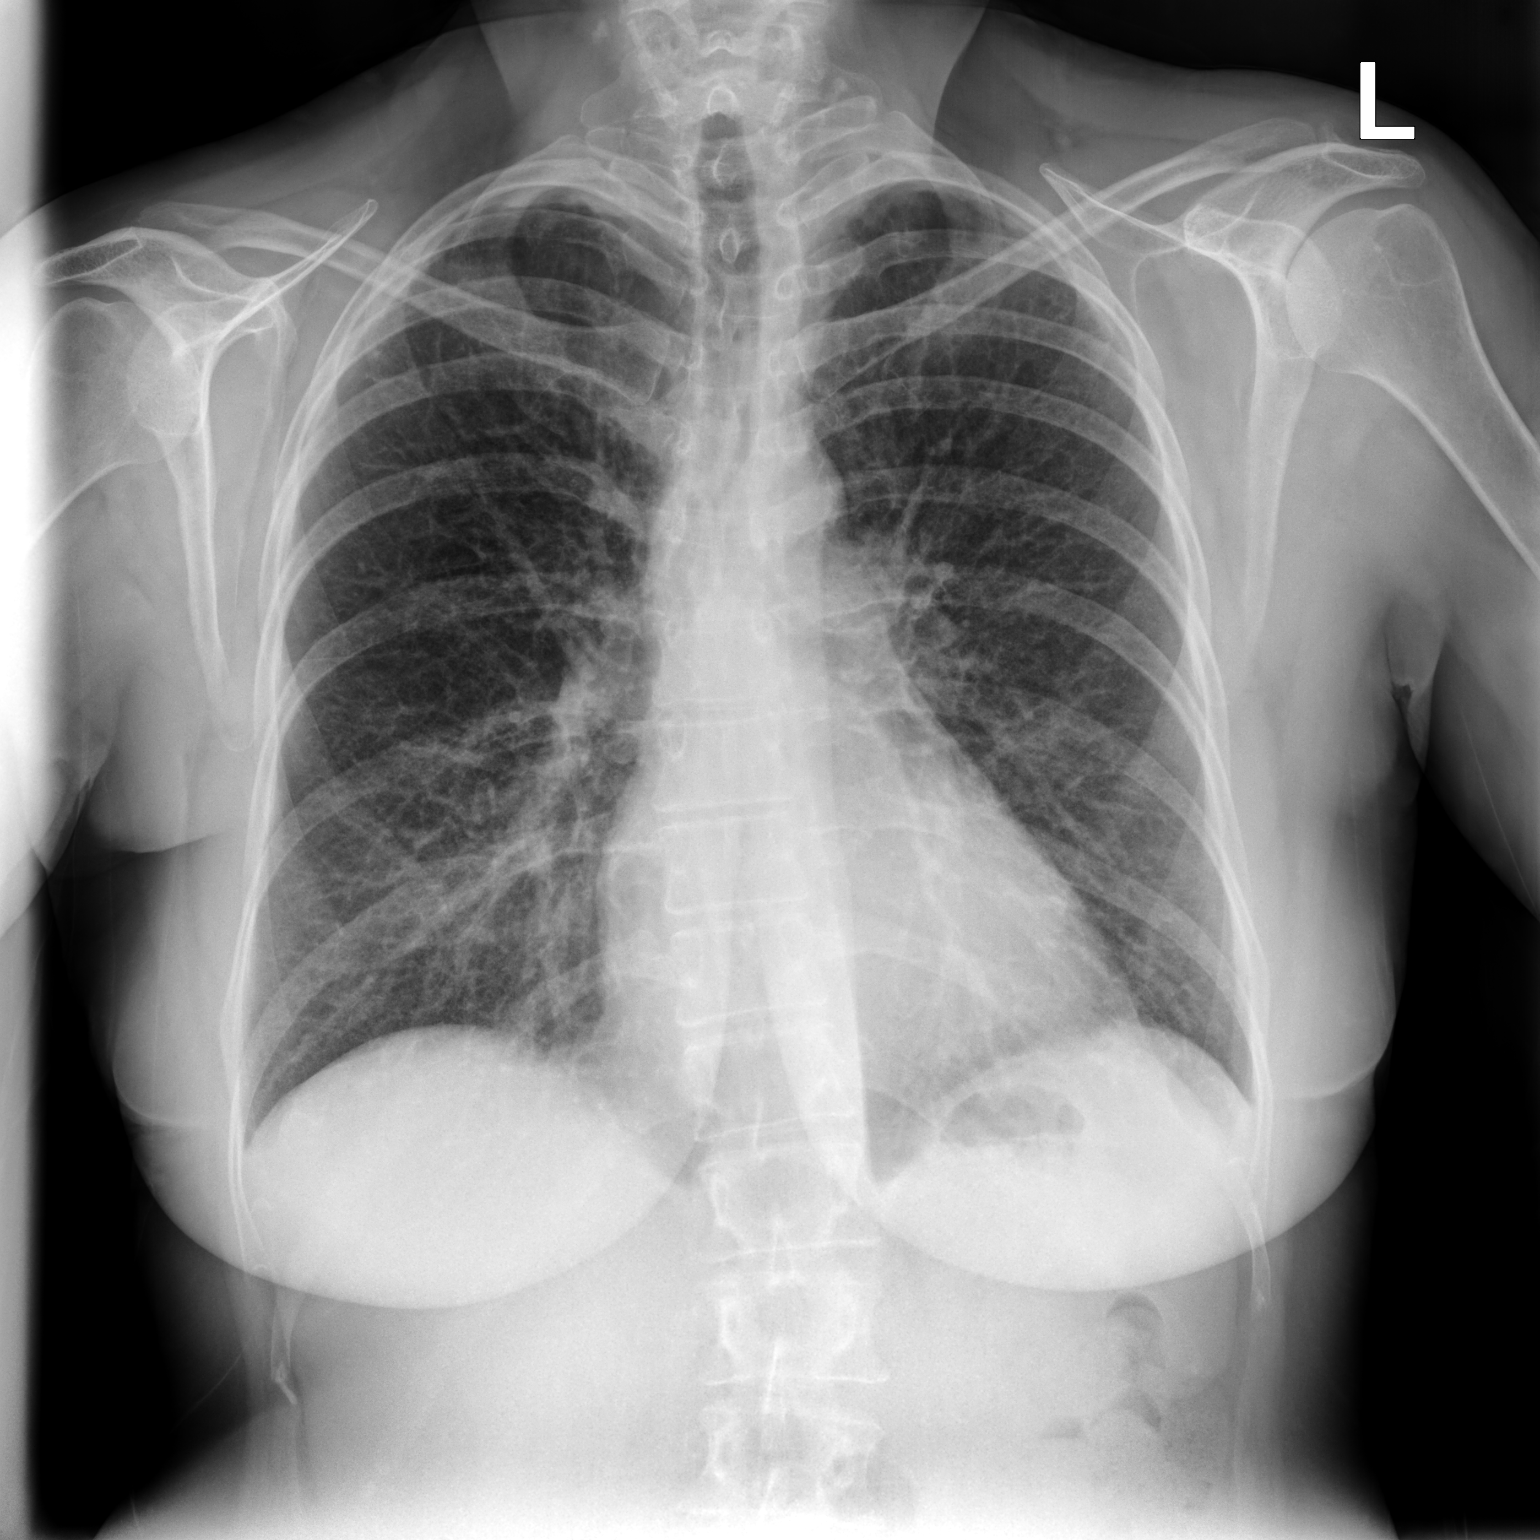

[chest lat]
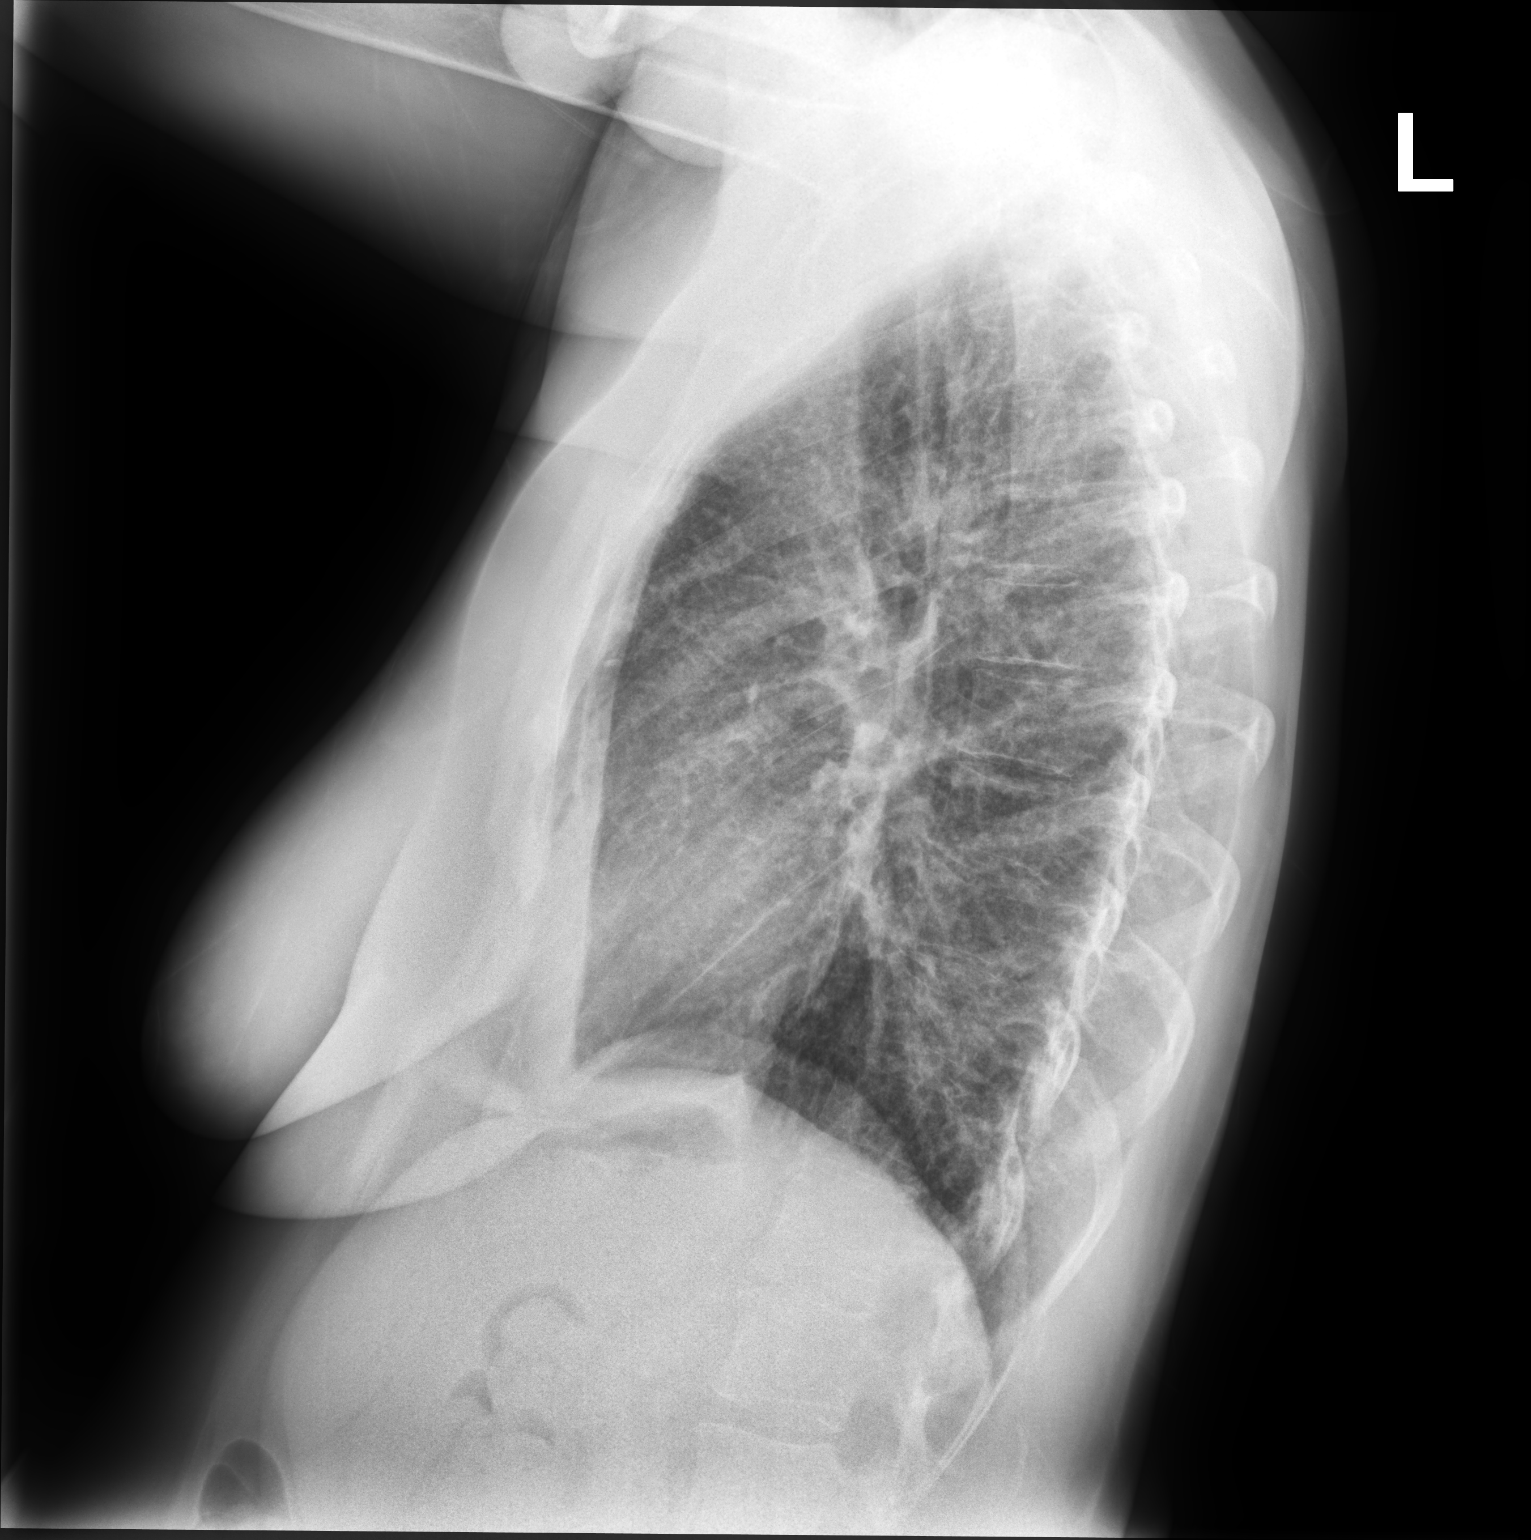

[2 of 2 positions shown; findings below may reference images not displayed]

FINDINGS: Cardiac shadows within normal limits. The lungs are well aerated
bilaterally. No focal infiltrate or sizable effusion is seen. No
bony abnormality is noted. Chronic interstitial changes are seen
bilaterally. No sizable effusion is noted. No bony abnormality is
seen.
IMPRESSION: Chronic interstitial changes bilaterally. No acute abnormality
noted.
